# Patient Record
Sex: Female | Born: 1955 | Race: White | Hispanic: No | Marital: Married | Smoking: Current every day smoker
Health system: Southern US, Community
[De-identification: ages and names within clinical notes are randomized; demographics above are authoritative.]

## PROBLEM LIST (undated history)

## (undated) DIAGNOSIS — M1990 Unspecified osteoarthritis, unspecified site: Secondary | ICD-10-CM

## (undated) DIAGNOSIS — J449 Chronic obstructive pulmonary disease, unspecified: Secondary | ICD-10-CM

## (undated) DIAGNOSIS — R112 Nausea with vomiting, unspecified: Secondary | ICD-10-CM

## (undated) DIAGNOSIS — T7840XA Allergy, unspecified, initial encounter: Secondary | ICD-10-CM

## (undated) DIAGNOSIS — R569 Unspecified convulsions: Secondary | ICD-10-CM

## (undated) DIAGNOSIS — J939 Pneumothorax, unspecified: Secondary | ICD-10-CM

## (undated) DIAGNOSIS — Z5189 Encounter for other specified aftercare: Secondary | ICD-10-CM

## (undated) DIAGNOSIS — Z889 Allergy status to unspecified drugs, medicaments and biological substances status: Secondary | ICD-10-CM

## (undated) DIAGNOSIS — T8859XA Other complications of anesthesia, initial encounter: Secondary | ICD-10-CM

## (undated) DIAGNOSIS — Z9889 Other specified postprocedural states: Secondary | ICD-10-CM

## (undated) HISTORY — DX: Unspecified convulsions: R56.9

## (undated) HISTORY — DX: Encounter for other specified aftercare: Z51.89

## (undated) HISTORY — PX: COLONOSCOPY: SHX174

## (undated) HISTORY — DX: Pneumothorax, unspecified: J93.9

## (undated) HISTORY — DX: Allergy, unspecified, initial encounter: T78.40XA

## (undated) HISTORY — DX: Unspecified osteoarthritis, unspecified site: M19.90

## (undated) HISTORY — PX: POLYPECTOMY: SHX149

## (undated) HISTORY — DX: Chronic obstructive pulmonary disease, unspecified: J44.9

## (undated) HISTORY — PX: CARPAL TUNNEL RELEASE: SHX101

## (undated) HISTORY — PX: TUBAL LIGATION: SHX77

## (undated) HISTORY — DX: Allergy status to unspecified drugs, medicaments and biological substances: Z88.9

---

## 1981-02-15 HISTORY — PX: THORACOTOMY: SUR1349

## 1998-11-12 ENCOUNTER — Other Ambulatory Visit: Admission: RE | Admit: 1998-11-12 | Discharge: 1998-11-12 | Payer: Self-pay | Admitting: Gynecology

## 1999-09-03 ENCOUNTER — Encounter: Payer: Self-pay | Admitting: Gynecology

## 1999-09-03 ENCOUNTER — Encounter: Admission: RE | Admit: 1999-09-03 | Discharge: 1999-09-03 | Payer: Self-pay | Admitting: Gynecology

## 1999-09-08 ENCOUNTER — Encounter: Admission: RE | Admit: 1999-09-08 | Discharge: 1999-09-08 | Payer: Self-pay | Admitting: Gynecology

## 1999-09-08 ENCOUNTER — Encounter: Payer: Self-pay | Admitting: Gynecology

## 1999-09-09 ENCOUNTER — Encounter: Payer: Self-pay | Admitting: Gynecology

## 1999-10-21 ENCOUNTER — Other Ambulatory Visit: Admission: RE | Admit: 1999-10-21 | Discharge: 1999-10-21 | Payer: Self-pay | Admitting: Gynecology

## 2000-11-14 ENCOUNTER — Other Ambulatory Visit: Admission: RE | Admit: 2000-11-14 | Discharge: 2000-11-14 | Payer: Self-pay | Admitting: Gynecology

## 2000-11-16 ENCOUNTER — Encounter: Payer: Self-pay | Admitting: Gynecology

## 2000-11-16 ENCOUNTER — Encounter: Admission: RE | Admit: 2000-11-16 | Discharge: 2000-11-16 | Payer: Self-pay | Admitting: Gynecology

## 2002-04-04 ENCOUNTER — Other Ambulatory Visit: Admission: RE | Admit: 2002-04-04 | Discharge: 2002-04-04 | Payer: Self-pay | Admitting: Gynecology

## 2005-04-08 ENCOUNTER — Other Ambulatory Visit: Admission: RE | Admit: 2005-04-08 | Discharge: 2005-04-08 | Payer: Self-pay | Admitting: Gynecology

## 2005-04-28 ENCOUNTER — Encounter: Admission: RE | Admit: 2005-04-28 | Discharge: 2005-04-28 | Payer: Self-pay | Admitting: Gynecology

## 2006-05-02 ENCOUNTER — Encounter: Admission: RE | Admit: 2006-05-02 | Discharge: 2006-05-02 | Payer: Self-pay | Admitting: Gynecology

## 2007-05-31 ENCOUNTER — Encounter: Admission: RE | Admit: 2007-05-31 | Discharge: 2007-05-31 | Payer: Self-pay | Admitting: Gynecology

## 2008-06-04 ENCOUNTER — Encounter: Admission: RE | Admit: 2008-06-04 | Discharge: 2008-06-04 | Payer: Self-pay | Admitting: Gynecology

## 2009-06-05 ENCOUNTER — Encounter: Admission: RE | Admit: 2009-06-05 | Discharge: 2009-06-05 | Payer: Self-pay | Admitting: Gynecology

## 2009-06-12 ENCOUNTER — Encounter: Admission: RE | Admit: 2009-06-12 | Discharge: 2009-06-12 | Payer: Self-pay | Admitting: Gynecology

## 2010-03-08 ENCOUNTER — Encounter: Payer: Self-pay | Admitting: Gynecology

## 2010-05-11 ENCOUNTER — Encounter (INDEPENDENT_AMBULATORY_CARE_PROVIDER_SITE_OTHER): Payer: Self-pay | Admitting: *Deleted

## 2010-05-21 ENCOUNTER — Other Ambulatory Visit: Payer: Self-pay | Admitting: Gynecology

## 2010-05-21 DIAGNOSIS — Z1231 Encounter for screening mammogram for malignant neoplasm of breast: Secondary | ICD-10-CM

## 2010-06-08 ENCOUNTER — Ambulatory Visit
Admission: RE | Admit: 2010-06-08 | Discharge: 2010-06-08 | Disposition: A | Discharge status: Home / Self Care | Payer: BC Managed Care – PPO | Source: Ambulatory Visit | Attending: Gynecology | Admitting: Gynecology

## 2010-06-08 DIAGNOSIS — Z1231 Encounter for screening mammogram for malignant neoplasm of breast: Secondary | ICD-10-CM

## 2010-06-26 ENCOUNTER — Ambulatory Visit (AMBULATORY_SURGERY_CENTER): Payer: BC Managed Care – PPO | Admitting: *Deleted

## 2010-06-26 VITALS — Ht 65.0 in | Wt 128.0 lb

## 2010-06-26 DIAGNOSIS — Z1211 Encounter for screening for malignant neoplasm of colon: Secondary | ICD-10-CM

## 2010-07-09 ENCOUNTER — Telehealth: Payer: Self-pay | Admitting: *Deleted

## 2010-07-09 NOTE — Telephone Encounter (Signed)
Pt went to pick up prep for tomorrows procedure and it was not at the pharmacy.  She needs the prep sent to [facility] on [address] this morning.
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
## 2010-07-10 ENCOUNTER — Encounter: Payer: Self-pay | Admitting: Internal Medicine

## 2010-07-10 ENCOUNTER — Ambulatory Visit (AMBULATORY_SURGERY_CENTER): Payer: BC Managed Care – PPO | Admitting: Internal Medicine

## 2010-07-10 VITALS — BP 95/68 | HR 68 | Temp 98.1°F | Resp 18 | Ht 65.0 in | Wt 128.0 lb

## 2010-07-10 DIAGNOSIS — Z1211 Encounter for screening for malignant neoplasm of colon: Secondary | ICD-10-CM

## 2010-07-10 DIAGNOSIS — D126 Benign neoplasm of colon, unspecified: Secondary | ICD-10-CM

## 2010-07-10 MED ORDER — SODIUM CHLORIDE 0.9 % IV SOLN: 500.0000 mL | INTRAVENOUS | Status: AC

## 2010-07-10 NOTE — Patient Instructions (Signed)
PLEASE FOLLOW DISCHARGE INSTRUCTIONS GIVEN TODAY. RESUME CURRENT MEDICATIONS. COLON POLYPS X2 REMOVED, YOU WILL RECEIVE RESULT LETTER IN YOUR MAIL IN 1-2 WEEKS. TRY TO FOLLOW HIGH FIBER DIET, SEE HANDOUT GIVEN. CALL us WITH ANY QUESTIONS OR CONCERNS. WE WILL DO A FOLLOW UP CALL ON  Tuesday MORNING. REPEAT COLONOSCOPY IN 5 YEARS.

## 2010-07-14 ENCOUNTER — Telehealth: Payer: Self-pay | Admitting: *Deleted

## 2010-07-14 NOTE — Telephone Encounter (Signed)

## 2010-07-21 ENCOUNTER — Encounter: Payer: Self-pay | Admitting: Internal Medicine

## 2010-08-10 NOTE — Letter (Signed)
Summary: Pre Visit Letter Revised  [facility]  [address] [place], [place] [address]   Phone: [phone]  Fax: [phone]        05/11/2010 MRN: [id number] [facility] [address] [place], [place]  [address]  [place]             Procedure Date:  07-10-10           Direct Colon---Dr. Juanda Chance   Welcome to the Gastroenterology Division at [facility].    You are scheduled to see a nurse for your pre-procedure visit on 06-26-10 at 4:30p.m. on the 3rd floor at [facility], [address]  We ask that you try to arrive at our office 15 minutes prior to your appointment time to allow for check-in.  Please take a minute to review the attached form.  If you answer "Yes" to one or more of the questions on the first page, we ask that you call the person listed at your earliest opportunity.  If you answer "No" to all of the questions, please complete the rest of the form and bring it to your appointment.    Your nurse visit will consist of discussing your medical and surgical history, your immediate family medical history, and your medications.   If you are unable to list all of your medications on the form, please bring the medication bottles to your appointment and we will list them.  We will need to be aware of both prescribed and over the counter drugs.  We will need to know exact dosage information as well.    Please be prepared to read and sign documents such as consent forms, a financial agreement, and acknowledgement forms.  If necessary, and with your consent, a friend or relative is welcome to sit-in on the nurse visit with you.  Please bring your insurance card so that we may make a copy of it.  If your insurance requires a referral to see a specialist, please bring your referral form from your primary care physician.  No co-pay is required for this nurse visit.     If you cannot keep your appointment, please call [phone] to cancel or reschedule  prior to your appointment date.  This allows [place] the opportunity to schedule an appointment for another patient in need of care.    Thank you for choosing [facility] for your medical needs.  We appreciate the opportunity to care for you.  Please visit [place] at our website  to learn more about our practice.  Sincerely, The Gastroenterology Division

## 2010-11-19 NOTE — Progress Notes (Signed)
Addended by: Maple Hudson on: 11/19/2010 05:29 PM   Modules accepted: Level of Service

## 2011-05-07 ENCOUNTER — Other Ambulatory Visit: Payer: Self-pay | Admitting: Gynecology

## 2011-05-07 DIAGNOSIS — Z1231 Encounter for screening mammogram for malignant neoplasm of breast: Secondary | ICD-10-CM

## 2011-06-10 ENCOUNTER — Ambulatory Visit
Admission: RE | Admit: 2011-06-10 | Discharge: 2011-06-10 | Disposition: A | Discharge status: Home / Self Care | Payer: Private Health Insurance - Indemnity | Source: Ambulatory Visit | Attending: Gynecology | Admitting: Gynecology

## 2011-06-10 DIAGNOSIS — Z1231 Encounter for screening mammogram for malignant neoplasm of breast: Secondary | ICD-10-CM

## 2011-06-28 ENCOUNTER — Other Ambulatory Visit: Payer: Self-pay | Admitting: Gynecology

## 2012-05-24 ENCOUNTER — Other Ambulatory Visit: Payer: Self-pay

## 2012-05-24 DIAGNOSIS — Z1231 Encounter for screening mammogram for malignant neoplasm of breast: Secondary | ICD-10-CM

## 2012-06-12 ENCOUNTER — Ambulatory Visit: Payer: Private Health Insurance - Indemnity

## 2012-06-16 ENCOUNTER — Ambulatory Visit: Payer: Private Health Insurance - Indemnity

## 2013-09-01 ENCOUNTER — Emergency Department (HOSPITAL_COMMUNITY)
Admission: EM | Admit: 2013-09-01 | Discharge: 2013-09-01 | Disposition: A | Discharge status: Home / Self Care | Payer: No Typology Code available for payment source | Attending: Emergency Medicine | Admitting: Emergency Medicine

## 2013-09-01 ENCOUNTER — Emergency Department (HOSPITAL_COMMUNITY): Payer: No Typology Code available for payment source

## 2013-09-01 ENCOUNTER — Encounter (HOSPITAL_COMMUNITY): Payer: Self-pay | Admitting: Emergency Medicine

## 2013-09-01 DIAGNOSIS — S20219A Contusion of unspecified front wall of thorax, initial encounter: Secondary | ICD-10-CM

## 2013-09-01 DIAGNOSIS — IMO0002 Reserved for concepts with insufficient information to code with codable children: Secondary | ICD-10-CM | POA: Insufficient documentation

## 2013-09-01 DIAGNOSIS — F17200 Nicotine dependence, unspecified, uncomplicated: Secondary | ICD-10-CM | POA: Diagnosis not present

## 2013-09-01 DIAGNOSIS — Z23 Encounter for immunization: Secondary | ICD-10-CM | POA: Insufficient documentation

## 2013-09-01 DIAGNOSIS — Z79899 Other long term (current) drug therapy: Secondary | ICD-10-CM | POA: Diagnosis not present

## 2013-09-01 DIAGNOSIS — Y9389 Activity, other specified: Secondary | ICD-10-CM | POA: Insufficient documentation

## 2013-09-01 DIAGNOSIS — S61409A Unspecified open wound of unspecified hand, initial encounter: Secondary | ICD-10-CM | POA: Diagnosis not present

## 2013-09-01 DIAGNOSIS — R404 Transient alteration of awareness: Secondary | ICD-10-CM | POA: Insufficient documentation

## 2013-09-01 DIAGNOSIS — S298XXA Other specified injuries of thorax, initial encounter: Secondary | ICD-10-CM | POA: Diagnosis present

## 2013-09-01 DIAGNOSIS — Z1881 Retained glass fragments: Secondary | ICD-10-CM

## 2013-09-01 DIAGNOSIS — S01309A Unspecified open wound of unspecified ear, initial encounter: Secondary | ICD-10-CM | POA: Diagnosis not present

## 2013-09-01 DIAGNOSIS — Y92410 Unspecified street and highway as the place of occurrence of the external cause: Secondary | ICD-10-CM | POA: Insufficient documentation

## 2013-09-01 DIAGNOSIS — S61209A Unspecified open wound of unspecified finger without damage to nail, initial encounter: Secondary | ICD-10-CM | POA: Insufficient documentation

## 2013-09-01 DIAGNOSIS — S61412A Laceration without foreign body of left hand, initial encounter: Secondary | ICD-10-CM

## 2013-09-01 DIAGNOSIS — S01311A Laceration without foreign body of right ear, initial encounter: Secondary | ICD-10-CM

## 2013-09-01 DIAGNOSIS — R911 Solitary pulmonary nodule: Secondary | ICD-10-CM

## 2013-09-01 MED ORDER — HYDROMORPHONE HCL PF 1 MG/ML IJ SOLN
1.0000 mg | Freq: Once | INTRAMUSCULAR | Status: AC
  Administered 2013-09-01: 1 mg via INTRAVENOUS
  Filled 2013-09-01: qty 1

## 2013-09-01 MED ORDER — OXYCODONE-ACETAMINOPHEN 5-325 MG PO TABS: 2.0000 | ORAL_TABLET | Freq: Four times a day (QID) | ORAL | Status: DC | PRN

## 2013-09-01 MED ORDER — TETANUS-DIPHTH-ACELL PERTUSSIS 5-2.5-18.5 LF-MCG/0.5 IM SUSP
0.5000 mL | Freq: Once | INTRAMUSCULAR | Status: AC
  Administered 2013-09-01: 0.5 mL via INTRAMUSCULAR
  Filled 2013-09-01: qty 0.5

## 2013-09-01 MED ORDER — IOHEXOL 300 MG/ML  SOLN
75.0000 mL | Freq: Once | INTRAMUSCULAR | Status: AC | PRN
  Administered 2013-09-01: 75 mL via INTRAVENOUS

## 2013-09-01 MED ORDER — ONDANSETRON HCL 4 MG/2ML IJ SOLN
4.0000 mg | Freq: Once | INTRAMUSCULAR | Status: AC
  Administered 2013-09-01: 4 mg via INTRAVENOUS
  Filled 2013-09-01: qty 2

## 2013-09-01 MED ORDER — SODIUM CHLORIDE 0.9 % IV BOLUS (SEPSIS)
1000.0000 mL | Freq: Once | INTRAVENOUS | Status: AC
  Administered 2013-09-01: 1000 mL via INTRAVENOUS
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
## 2013-09-01 NOTE — ED Provider Notes (Signed)
LACERATION REPAIR Performed by: Doreene Burke PA-S2 under my supervision Authorized by: Faustino Congress Consent: Verbal consent obtained. Risks and benefits: risks, benefits and alternatives were discussed Consent given by: patient Patient identity confirmed: provided demographic data Prepped and Draped in normal sterile fashion Wound explored  Laceration Location: left hand, base of 2nd digit  Laceration Length: 1.5 cm  No Foreign Bodies seen or palpated  Anesthesia: local infiltration  Local anesthetic: lidocaine 1%  Anesthetic total: 3 ml  Irrigation method: bulk flow with 1000cc NS, skin scrub Amount of cleaning: standard  Skin closure: 5-0 Ethilon  Number of sutures: 2  Technique: simple interrupted  Patient tolerance: Patient tolerated the procedure well with no immediate complications.    LACERATION REPAIR Performed by: Doreene Burke PA-S2 under my supervision Authorized by: Faustino Congress Consent: Verbal consent obtained. Risks and benefits: risks, benefits and alternatives were discussed Consent given by: patient Patient identity confirmed: provided demographic data Prepped and Draped in normal sterile fashion Wound explored  Laceration Location: 3rd digit, volar near crease of PIP  Laceration Length: 1 cm  No Foreign Bodies seen or palpated  Anesthesia: local infiltration  Local anesthetic: lidocaine 1%  Anesthetic total: 1 ml  Irrigation method: syringe with saline Amount of cleaning: standard  Skin closure: 5-0 Ethilon  Number of sutures: 1  Technique: simple interrupted  Patient tolerance: Patient tolerated the procedure well with no immediate complications.     LACERATION REPAIR Performed by: Doreene Burke PA-S2 under my supervision Authorized by: Faustino Congress Consent: Verbal consent obtained. Risks and benefits: risks, benefits and alternatives were discussed Consent given by:  patient Patient identity confirmed: provided demographic data Prepped and Draped in normal sterile fashion Wound explored  Laceration Location: inferior R ear onto face  Laceration Length: 2.5cm  No Foreign Bodies seen or palpated  Anesthesia: local infiltration  Local anesthetic: lidocaine 1%  Anesthetic total: 3 ml  Irrigation method: syringe Amount of cleaning: standard  Skin closure: 6-0 Ethilon  Number of sutures: 6  Technique: simple interrupted  Patient tolerance: Patient tolerated the procedure well with no immediate complications.   Carlisle Cater, PA-C 09/01/13 1718

## 2013-09-01 NOTE — ED Provider Notes (Signed)
Need for f/u for CT possible nodule d/w Pt/family.  Marisa Relic, MD 09/01/13 [phone]
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
## 2013-09-01 NOTE — ED Notes (Signed)
Patient to CT by stretcher/spine precautions maintained

## 2013-11-29 ENCOUNTER — Other Ambulatory Visit: Payer: Self-pay | Admitting: Physician Assistant

## 2013-11-29 DIAGNOSIS — R9389 Abnormal findings on diagnostic imaging of other specified body structures: Secondary | ICD-10-CM

## 2013-12-06 ENCOUNTER — Ambulatory Visit
Admission: RE | Admit: 2013-12-06 | Discharge: 2013-12-06 | Disposition: A | Discharge status: Home / Self Care | Payer: 59 | Source: Ambulatory Visit | Attending: Physician Assistant | Admitting: Physician Assistant

## 2013-12-06 DIAGNOSIS — R9389 Abnormal findings on diagnostic imaging of other specified body structures: Secondary | ICD-10-CM

## 2013-12-13 ENCOUNTER — Encounter: Payer: Self-pay | Admitting: Internal Medicine

## 2013-12-13 ENCOUNTER — Other Ambulatory Visit (INDEPENDENT_AMBULATORY_CARE_PROVIDER_SITE_OTHER): Payer: 59

## 2013-12-13 ENCOUNTER — Encounter (INDEPENDENT_AMBULATORY_CARE_PROVIDER_SITE_OTHER): Payer: Self-pay

## 2013-12-13 ENCOUNTER — Ambulatory Visit (INDEPENDENT_AMBULATORY_CARE_PROVIDER_SITE_OTHER): Payer: 59 | Admitting: Internal Medicine

## 2013-12-13 VITALS — BP 120/70 | HR 69 | Ht 65.0 in | Wt 130.0 lb

## 2013-12-13 DIAGNOSIS — R918 Other nonspecific abnormal finding of lung field: Secondary | ICD-10-CM

## 2013-12-13 DIAGNOSIS — J449 Chronic obstructive pulmonary disease, unspecified: Secondary | ICD-10-CM

## 2013-12-13 LAB — CBC WITH DIFFERENTIAL/PLATELET
Basophils Absolute: 0 10*3/uL (ref 0.0–0.1)
Basophils Relative: 0.6 % (ref 0.0–3.0)
EOS PCT: 3 % (ref 0.0–5.0)
Eosinophils Absolute: 0.2 10*3/uL (ref 0.0–0.7)
HCT: 45.8 % (ref 36.0–46.0)
Hemoglobin: 15.3 g/dL — ABNORMAL HIGH (ref 12.0–15.0)
LYMPHS PCT: 32.2 % (ref 12.0–46.0)
Lymphs Abs: 2.2 10*3/uL (ref 0.7–4.0)
MCHC: 33.3 g/dL (ref 30.0–36.0)
MCV: 86.3 fl (ref 78.0–100.0)
Monocytes Absolute: 0.5 10*3/uL (ref 0.1–1.0)
Monocytes Relative: 8 % (ref 3.0–12.0)
NEUTROS PCT: 56.2 % (ref 43.0–77.0)
Neutro Abs: 3.9 10*3/uL (ref 1.4–7.7)
PLATELETS: 198 10*3/uL (ref 150.0–400.0)
RBC: 5.32 Mil/uL — ABNORMAL HIGH (ref 3.87–5.11)
RDW: 14.3 % (ref 11.5–15.5)
WBC: 6.9 10*3/uL (ref 4.0–10.5)

## 2013-12-13 LAB — SEDIMENTATION RATE: Sed Rate: 20 mm/hr (ref 0–22)

## 2013-12-13 MED ORDER — LEVOFLOXACIN 500 MG PO TABS: 500.0000 mg | ORAL_TABLET | Freq: Every day | ORAL | Status: DC

## 2013-12-13 NOTE — Patient Instructions (Addendum)
Please remember to go to the lab   department downstairs for your tests - we will call you with the results when they are available.  Levaquin 500 mg daily x 10 days then high resolution CT chest in 2 weeks - see coordinator to schedule  We need to see you same day as CT
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
## 2013-12-15 DIAGNOSIS — J449 Chronic obstructive pulmonary disease, unspecified: Secondary | ICD-10-CM | POA: Insufficient documentation

## 2013-12-15 NOTE — Assessment & Plan Note (Signed)
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

## 2013-12-15 NOTE — Assessment & Plan Note (Signed)
Needs pft and quit smoking, discussed  Also sent for alpha one Testing to be complete

## 2013-12-16 LAB — QUANTIFERON TB GOLD ASSAY (BLOOD)
INTERFERON GAMMA RELEASE ASSAY: NEGATIVE
Mitogen value: 3.41 IU/mL
Quantiferon Nil Value: 0.02 IU/mL
Quantiferon Tb Ag Minus Nil Value: 0 IU/mL
TB Ag value: 0.02 IU/mL

## 2013-12-17 LAB — ALPHA-1-ANTITRYPSIN: A1 ANTITRYPSIN SER: 190 mg/dL (ref 83–199)

## 2013-12-20 LAB — ALPHA-1 ANTITRYPSIN PHENOTYPE: A1 ANTITRYPSIN: 179 mg/dL (ref 83–199)

## 2013-12-20 NOTE — Progress Notes (Signed)
Quick Note:  Spoke with pt and notified of results per Dr. Wert. Pt verbalized understanding and denied any questions.  ______ 

## 2013-12-27 ENCOUNTER — Inpatient Hospital Stay: Admission: RE | Admit: 2013-12-27 | Payer: 59 | Source: Ambulatory Visit

## 2013-12-28 ENCOUNTER — Ambulatory Visit (INDEPENDENT_AMBULATORY_CARE_PROVIDER_SITE_OTHER)
Admission: RE | Admit: 2013-12-28 | Discharge: 2013-12-28 | Disposition: A | Discharge status: Home / Self Care | Payer: 59 | Source: Ambulatory Visit | Attending: Internal Medicine | Admitting: Internal Medicine

## 2013-12-28 ENCOUNTER — Ambulatory Visit: Payer: 59 | Admitting: Internal Medicine

## 2013-12-28 DIAGNOSIS — R918 Other nonspecific abnormal finding of lung field: Secondary | ICD-10-CM

## 2013-12-31 ENCOUNTER — Encounter: Payer: Self-pay | Admitting: Internal Medicine

## 2013-12-31 ENCOUNTER — Ambulatory Visit (INDEPENDENT_AMBULATORY_CARE_PROVIDER_SITE_OTHER): Payer: 59 | Admitting: Internal Medicine

## 2013-12-31 ENCOUNTER — Other Ambulatory Visit (INDEPENDENT_AMBULATORY_CARE_PROVIDER_SITE_OTHER): Payer: 59

## 2013-12-31 VITALS — BP 106/60 | HR 80 | Ht 65.0 in | Wt 132.0 lb

## 2013-12-31 DIAGNOSIS — J479 Bronchiectasis, uncomplicated: Secondary | ICD-10-CM | POA: Insufficient documentation

## 2013-12-31 DIAGNOSIS — R918 Other nonspecific abnormal finding of lung field: Secondary | ICD-10-CM

## 2013-12-31 DIAGNOSIS — J449 Chronic obstructive pulmonary disease, unspecified: Secondary | ICD-10-CM

## 2013-12-31 DIAGNOSIS — F17210 Nicotine dependence, cigarettes, uncomplicated: Secondary | ICD-10-CM

## 2013-12-31 DIAGNOSIS — Z720 Tobacco use: Secondary | ICD-10-CM

## 2013-12-31 LAB — CBC WITH DIFFERENTIAL/PLATELET
BASOS ABS: 0.1 10*3/uL (ref 0.0–0.1)
Basophils Relative: 0.7 % (ref 0.0–3.0)
Eosinophils Absolute: 0.4 10*3/uL (ref 0.0–0.7)
Eosinophils Relative: 5.3 % — ABNORMAL HIGH (ref 0.0–5.0)
HCT: 44.2 % (ref 36.0–46.0)
HEMOGLOBIN: 14.3 g/dL (ref 12.0–15.0)
LYMPHS PCT: 29.5 % (ref 12.0–46.0)
Lymphs Abs: 2.5 10*3/uL (ref 0.7–4.0)
MCHC: 32.3 g/dL (ref 30.0–36.0)
MCV: 87.5 fl (ref 78.0–100.0)
MONOS PCT: 7.3 % (ref 3.0–12.0)
Monocytes Absolute: 0.6 10*3/uL (ref 0.1–1.0)
Neutro Abs: 4.8 10*3/uL (ref 1.4–7.7)
Neutrophils Relative %: 57.2 % (ref 43.0–77.0)
Platelets: 194 10*3/uL (ref 150.0–400.0)
RBC: 5.06 Mil/uL (ref 3.87–5.11)
RDW: 14.3 % (ref 11.5–15.5)
WBC: 8.3 10*3/uL (ref 4.0–10.5)

## 2013-12-31 MED ORDER — AMOXICILLIN-POT CLAVULANATE 875-125 MG PO TABS: 1.0000 | ORAL_TABLET | Freq: Two times a day (BID) | ORAL | Status: DC
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
## 2013-12-31 NOTE — Patient Instructions (Addendum)
Bronchiectasis =   you have scarring of your bronchial tubes which means that they don't function perfectly normally and mucus tends to pool in certain areas of your lung which can cause pneumonia and further scarring of your lung and bronchial tubes  Whenever you develop cough congestion take mucinex or mucinex dm > these will help keep the mucus loose and flowing but if your condition worsens you need to seek help immediately preferably here or somewhere inside the [facility] system to compare xrays ( worse = darker or bloody mucus or pain on breathing in)     Please see patient coordinator before you leave today  to schedule sinus CT at your convenience   Please remember to go to the lab  department downstairs for your tests - we will call you with the results when they are available.  If get worse cough or sinus symtpoms take Augmentin 875 mg take one pill twice daily  X 10 days - take at breakfast and supper with large glass of water.  It would help reduce the usual side effects (diarrhea and yeast infections) if you ate cultured yogurt at lunch.   The key is to stop smoking completely before smoking completely stops you!     Late add :  F/u one m with pfts

## 2014-01-01 ENCOUNTER — Encounter: Payer: Self-pay | Admitting: Internal Medicine

## 2014-01-01 ENCOUNTER — Other Ambulatory Visit: Payer: Self-pay | Admitting: Internal Medicine

## 2014-01-01 DIAGNOSIS — F17210 Nicotine dependence, cigarettes, uncomplicated: Secondary | ICD-10-CM | POA: Insufficient documentation

## 2014-01-01 DIAGNOSIS — J449 Chronic obstructive pulmonary disease, unspecified: Secondary | ICD-10-CM

## 2014-01-01 LAB — IMMUNOGLOBULINS A/E/G/M, SERUM
IGE (IMMUNOGLOBULIN E), SERUM: 10 [IU]/mL (ref 0–100)
IGM (IMMUNOGLOBULIN M), SRM: 209 mg/dL (ref 40–230)
IgA/Immunoglobulin A, Serum: 196 mg/dL (ref 91–414)
IgG (Immunoglobin G), Serum: 986 mg/dL (ref 700–1600)

## 2014-01-01 LAB — ANCA SCREEN W REFLEX TITER
ATYPICAL P-ANCA SCREEN: NEGATIVE
C-ANCA SCREEN: NEGATIVE
p-ANCA Screen: NEGATIVE

## 2014-01-01 NOTE — Progress Notes (Signed)
Quick Note:  LMTCB ______ 

## 2014-01-01 NOTE — Assessment & Plan Note (Signed)
-   Complicated by L PTX in the 1980s requiring surgery  - alpha one testing 10/291/5 > MM  No need for meds at this point, see smoking a/p Needs baseline pfts

## 2014-01-01 NOTE — Assessment & Plan Note (Signed)
I took an extended  opportunity with this patient to outline the consequences of continued cigarette use  in airway disorders based on all the data we have from the multiple national lung health studies (perfomed over decades at millions of dollars in cost)  indicating that smoking cessation, not choice of inhalers or physicians, is the most important aspect of care.   

## 2014-01-01 NOTE — Assessment & Plan Note (Signed)
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

## 2014-01-01 NOTE — Progress Notes (Signed)
Quick Note:  Spoke with pt and notified of results per Dr. Wert. Pt verbalized understanding and denied any questions.  ______ 

## 2014-01-01 NOTE — Assessment & Plan Note (Addendum)
Alpha one screening 12/13/13  MM See HRCT  Chest 12/31/2013  - Quant Ig s 12/31/13 wnl  Also needs sinus ct to complete the w/u

## 2014-01-02 NOTE — Progress Notes (Signed)
Quick Note:  Pt aware ______ 

## 2014-01-04 ENCOUNTER — Ambulatory Visit (INDEPENDENT_AMBULATORY_CARE_PROVIDER_SITE_OTHER)
Admission: RE | Admit: 2014-01-04 | Discharge: 2014-01-04 | Disposition: A | Discharge status: Home / Self Care | Payer: 59 | Source: Ambulatory Visit | Attending: Internal Medicine | Admitting: Internal Medicine

## 2014-01-04 DIAGNOSIS — J479 Bronchiectasis, uncomplicated: Secondary | ICD-10-CM

## 2014-01-07 NOTE — Progress Notes (Signed)
Quick Note:  LMTCB ______ 

## 2014-01-08 NOTE — Progress Notes (Signed)
Quick Note:  Spoke with pt and notified of results per Dr. Wert. Pt verbalized understanding and denied any questions.  ______ 

## 2014-01-31 ENCOUNTER — Ambulatory Visit: Payer: 59 | Admitting: Internal Medicine

## 2015-05-07 ENCOUNTER — Ambulatory Visit (INDEPENDENT_AMBULATORY_CARE_PROVIDER_SITE_OTHER): Payer: Commercial Managed Care - HMO | Admitting: Physician Assistant

## 2015-05-07 VITALS — BP 114/82 | HR 82 | Temp 98.9°F | Resp 16 | Ht 66.0 in | Wt 124.2 lb

## 2015-05-07 DIAGNOSIS — F17210 Nicotine dependence, cigarettes, uncomplicated: Secondary | ICD-10-CM | POA: Diagnosis not present

## 2015-05-07 DIAGNOSIS — J069 Acute upper respiratory infection, unspecified: Secondary | ICD-10-CM | POA: Diagnosis not present

## 2015-05-07 DIAGNOSIS — B9789 Other viral agents as the cause of diseases classified elsewhere: Secondary | ICD-10-CM

## 2015-05-07 MED ORDER — AZITHROMYCIN 250 MG PO TABS: ORAL_TABLET | ORAL | Status: DC

## 2015-05-07 MED ORDER — PREDNISONE 20 MG PO TABS: ORAL_TABLET | ORAL | Status: DC

## 2015-05-07 NOTE — Patient Instructions (Signed)
     IF you received an x-ray today, you will receive an invoice from [facility]. Please contact [facility] at [phone] with questions or concerns regarding your invoice.   IF you received labwork today, you will receive an invoice from [facility]. Please contact [facility] at [phone] with questions or concerns regarding your invoice.   Our billing staff will not be able to assist you with questions regarding bills from these companies.  You will be contacted with the lab results as soon as they are available. The fastest way to get your results is to activate your My Chart account. Instructions are located on the last page of this paperwork. If you have not heard from us regarding the results in 2 weeks, please contact this office.      

## 2015-05-07 NOTE — Progress Notes (Signed)
05/07/2015 3:55 PM   DOB: 04/17/1955 / MRN: [id number]  SUBJECTIVE:  Marisa Golden is a 60 y.o. female presenting for cough that has been present for 12 days total. She also complains of nasal congestion. She has tried Mucinex without relief.  She feels that she is getting worse.  She has a 40 pack year history of smoking and is not interested in quitting today.  She denies fever, chills, nausea, and diaphoresis.  She denies SOB, DOE and chest pain.    She is allergic to morphine and related.   She  has a past medical history of Pneumothorax and Multiple allergies.    She  reports that she has been smoking Cigarettes.  She has a 40 pack-year smoking history. She has never used smokeless tobacco. She reports that she does not drink alcohol or use illicit drugs. She  has no sexual activity history on file. The patient  has past surgical history that includes Thoracotomy (1983); Tubal ligation; and Carpal tunnel release.  Her family history includes Breast cancer in her maternal aunt.  Review of Systems  Constitutional: Positive for malaise/fatigue. Negative for fever, chills and diaphoresis.  HENT: Positive for congestion and sore throat.   Respiratory: Positive for cough. Negative for hemoptysis, shortness of breath and wheezing.   Cardiovascular: Negative for chest pain.  Gastrointestinal: Negative for nausea.  Skin: Negative for rash.  Neurological: Negative for dizziness and weakness.  Endo/Heme/Allergies: Negative for polydipsia.    Problem list and medications reviewed and updated by myself where necessary, and exist elsewhere in the encounter.   OBJECTIVE:  BP 114/82 mmHg  Pulse 82  Temp(Src) 98.9 F (37.2 C) (Oral)  Resp 16  Ht 5\' 6"  (1.676 m)  Wt 124 lb 3.2 oz (56.337 kg)  BMI 20.06 kg/m2  SpO2 95%  Physical Exam  Constitutional: She is oriented to person, place, and time.  HENT:  Right Ear: External ear normal.  Left Ear: External ear normal.  Nose: Mucosal edema  present. Right sinus exhibits no maxillary sinus tenderness and no frontal sinus tenderness. Left sinus exhibits no maxillary sinus tenderness and no frontal sinus tenderness.  Mouth/Throat: Oropharynx is clear and moist. No oropharyngeal exudate.  Eyes: Conjunctivae are normal. Pupils are equal, round, and reactive to light.  Cardiovascular: Regular rhythm and normal heart sounds.   Pulmonary/Chest: Effort normal and breath sounds normal. No respiratory distress. She has no wheezes. She has no rales. She exhibits no tenderness.  Neurological: She is alert and oriented to person, place, and time.  Skin: Skin is warm and dry. No rash noted. She is not diaphoretic. No erythema.  Psychiatric: Her behavior is normal.    No results found for this or any previous visit (from the past 72 hour(s)).  No results found.  ASSESSMENT AND PLAN  Lorie was seen today for sinus problem and chest congestion.  Diagnoses and all orders for this visit:  Smoking greater than 40 pack years -     azithromycin (ZITHROMAX) 250 MG tablet; Do not fill until 05/12/15.  Viral upper respiratory tract infection with cough: Given problem one and the duration of her symtpoms I am worried she may be having a COPD flare. I doubt infection, however advised that is the prednisone does not make her better then she may fill the abx as written.  RTC if worse or if not improved with the plan.  -     predniSONE (DELTASONE) 20 MG tablet; Take two in the morning  with food.   The patient was advised to call or return to clinic if she does not see an improvement in symptoms or to seek the care of the closest emergency department if she worsens with the above plan.   Philis Fendt, MHS, PA-C [facility] 05/07/2015 3:55 PM

## 2015-06-04 ENCOUNTER — Encounter: Payer: Self-pay | Admitting: Gastroenterology

## 2015-06-27 ENCOUNTER — Ambulatory Visit
Admission: RE | Admit: 2015-06-27 | Discharge: 2015-06-27 | Disposition: A | Discharge status: Home / Self Care | Payer: Commercial Managed Care - HMO | Source: Ambulatory Visit | Attending: Physician Assistant | Admitting: Physician Assistant

## 2015-06-27 ENCOUNTER — Other Ambulatory Visit: Payer: Self-pay | Admitting: Physician Assistant

## 2015-06-27 DIAGNOSIS — R059 Cough, unspecified: Secondary | ICD-10-CM

## 2015-06-27 DIAGNOSIS — R05 Cough: Secondary | ICD-10-CM

## 2015-07-10 ENCOUNTER — Ambulatory Visit (AMBULATORY_SURGERY_CENTER): Payer: Self-pay | Admitting: *Deleted

## 2015-07-10 VITALS — Ht 65.0 in | Wt 121.6 lb

## 2015-07-10 DIAGNOSIS — Z86010 Personal history of colonic polyps: Secondary | ICD-10-CM

## 2015-07-10 NOTE — Progress Notes (Signed)
No egg or soy allergy known to patient   issues with past sedation with any surgeries  or procedures of post op n/V  no intubation problems  No diet pills per patient No home 02 use per patient  No blood thinners per patient  Pt denies issues with constipation

## 2015-07-18 ENCOUNTER — Encounter: Payer: Self-pay | Admitting: Gastroenterology

## 2015-07-25 ENCOUNTER — Encounter: Payer: Self-pay | Admitting: Gastroenterology

## 2015-07-25 ENCOUNTER — Ambulatory Visit (AMBULATORY_SURGERY_CENTER): Payer: Commercial Managed Care - HMO | Admitting: Gastroenterology

## 2015-07-25 VITALS — BP 122/81 | HR 69 | Temp 97.8°F | Resp 14 | Ht 65.0 in | Wt 121.0 lb

## 2015-07-25 DIAGNOSIS — D125 Benign neoplasm of sigmoid colon: Secondary | ICD-10-CM | POA: Diagnosis not present

## 2015-07-25 DIAGNOSIS — Z86010 Personal history of colonic polyps: Secondary | ICD-10-CM | POA: Diagnosis not present

## 2015-07-25 DIAGNOSIS — D128 Benign neoplasm of rectum: Secondary | ICD-10-CM

## 2015-07-25 MED ORDER — SODIUM CHLORIDE 0.9 % IV SOLN: 500.0000 mL | INTRAVENOUS | Status: DC

## 2015-07-25 NOTE — Progress Notes (Signed)
Called to room to assist during endoscopic procedure.  Patient ID and intended procedure confirmed with present staff. Received instructions for my participation in the procedure from the performing physician.  

## 2015-07-25 NOTE — Patient Instructions (Signed)
FOLLOW DISCHARGE INSTRUCTIONS (BLUE AND GREEN SHEETS).YOU HAD AN ENDOSCOPIC PROCEDURE TODAY AT [facility]:   Refer to the procedure report that was given to you for any specific questions about what was found during the examination.  If the procedure report does not answer your questions, please call your gastroenterologist to clarify.  If you requested that your care partner not be given the details of your procedure findings, then the procedure report has been included in a sealed envelope for you to review at your convenience later.  YOU SHOULD EXPECT: Some feelings of bloating in the abdomen. Passage of more gas than usual.  Walking can help get rid of the air that was put into your GI tract during the procedure and reduce the bloating. If you had a lower endoscopy (such as a colonoscopy or flexible sigmoidoscopy) you may notice spotting of blood in your stool or on the toilet paper. If you underwent a bowel prep for your procedure, you may not have a normal bowel movement for a few days.  Please Note:  You might notice some irritation and congestion in your nose or some drainage.  This is from the oxygen used during your procedure.  There is no need for concern and it should clear up in a day or so.  SYMPTOMS TO REPORT IMMEDIATELY:   Following lower endoscopy (colonoscopy or flexible sigmoidoscopy):  Excessive amounts of blood in the stool  Significant tenderness or worsening of abdominal pains  Swelling of the abdomen that is new, acute  Fever of 100F or higher   For urgent or emergent issues, a gastroenterologist can be reached at any hour by calling [phone].   DIET: Your first meal following the procedure should be a small meal and then it is ok to progress to your normal diet. Heavy or fried foods are harder to digest and may make you feel nauseous or bloated.  Likewise, meals heavy in dairy and vegetables can increase bloating.  Drink plenty of fluids but you  should avoid alcoholic beverages for 24 hours.  ACTIVITY:  You should plan to take it easy for the rest of today and you should NOT DRIVE or use heavy machinery until tomorrow (because of the sedation medicines used during the test).    FOLLOW UP: Our staff will call the number listed on your records the next business day following your procedure to check on you and address any questions or concerns that you may have regarding the information given to you following your procedure. If we do not reach you, we will leave a message.  However, if you are feeling well and you are not experiencing any problems, there is no need to return our call.  We will assume that you have returned to your regular daily activities without incident.  If any biopsies were taken you will be contacted by phone or by letter within the next 1-3 weeks.  Please call us at [phone] if you have not heard about the biopsies in 3 weeks.    SIGNATURES/CONFIDENTIALITY: You and/or your care partner have signed paperwork which will be entered into your electronic medical record.  These signatures attest to the fact that that the information above on your After Visit Summary has been reviewed and is understood.  Full responsibility of the confidentiality of this discharge information lies with you and/or your care-partner.  Polyps, diverticulosis, high fiber diet information.

## 2015-07-25 NOTE — Progress Notes (Signed)
Patient awakening,vss,report to rn 

## 2015-07-25 NOTE — Op Note (Signed)
[facility] Patient Name: Marisa Golden Procedure Date: 07/25/2015 9:10 AM MRN: [id number] Endoscopist: Mallie Mussel L. Loletha Carrow , MD Age: 60 Referring MD:  Date of Birth: 1955-10-04 Gender: Female Procedure:                Colonoscopy Indications:              Surveillance: Personal history of adenomatous                            polyps on last colonoscopy > 5 years ago (36mm TA in                            06/2010) Medicines:                Monitored Anesthesia Care Procedure:                Pre-Anesthesia Assessment:                           - Prior to the procedure, a History and Physical                            was performed, and patient medications and                            allergies were reviewed. The patient's tolerance of                            previous anesthesia was also reviewed. The risks                            and benefits of the procedure and the sedation                            options and risks were discussed with the patient.                            All questions were answered, and informed consent                            was obtained. Prior Anticoagulants: The patient has                            taken no previous anticoagulant or antiplatelet                            agents. ASA Grade Assessment: II - A patient with                            mild systemic disease. After reviewing the risks                            and benefits, the patient was deemed in  satisfactory condition to undergo the procedure.                           After obtaining informed consent, the colonoscope                            was passed under direct vision. Throughout the                            procedure, the patient's blood pressure, pulse, and                            oxygen saturations were monitored continuously. The                            Model CF-HQ190L [id number]) scope was introduced   through the anus and advanced to the the cecum,                            identified by appendiceal orifice and ileocecal                            valve. The colonoscopy was performed without                            difficulty. The patient tolerated the procedure                            well. The quality of the bowel preparation was                            excellent. The ileocecal valve, appendiceal                            orifice, and rectum were photographed. The bowel                            preparation used was Miralax. Scope In: 9:17:13 AM Scope Out: 9:37:30 AM Scope Withdrawal Time: 0 hours 13 minutes 28 seconds  Total Procedure Duration: 0 hours 20 minutes 17 seconds  Findings:                 The perianal and digital rectal examinations were                            normal.                           A 2 mm polyp was found in the proximal sigmoid                            colon. The polyp was sessile. The polyp was removed                            with a cold biopsy forceps. Resection and  retrieval                            were complete.                           A 6 mm polyp was found in the rectum. The polyp was                            sessile. The polyp was removed with a hot snare.                            Resection and retrieval were complete.                           Multiple small-mouthed diverticula were found in                            the sigmoid colon.                           Retroflexion in the rectum was not performed due to                            anatomy.                           The exam was otherwise without abnormality. Complications:            No immediate complications. Estimated Blood Loss:     Estimated blood loss: none. Impression:               - One 2 mm polyp in the proximal sigmoid colon,                            removed with a cold biopsy forceps. Resected and                            retrieved.                            - One 6 mm polyp in the rectum, removed with a hot                            snare. Resected and retrieved.                           - Diverticulosis in the sigmoid colon.                           - The examination was otherwise normal. Recommendation:           - Patient has a contact number available for                            emergencies. The signs and symptoms of potential  delayed complications were discussed with the                            patient. Return to normal activities tomorrow.                            Written discharge instructions were provided to the                            patient.                           - Resume previous diet.                           - No aspirin, ibuprofen, naproxen, or other                            non-steroidal anti-inflammatory drugs for 5 days                            after polyp removal.                           - Await pathology results.                           - Repeat colonoscopy is recommended for                            surveillance. The colonoscopy date will be                            determined after pathology results from today's                            exam become available for review. Henry L. Loletha Carrow, MD 07/25/2015 9:42:39 AM This report has been signed electronically.

## 2015-07-28 ENCOUNTER — Telehealth: Payer: Self-pay

## 2015-07-28 NOTE — Telephone Encounter (Signed)
  Follow up Call-  Call back number 07/25/2015  Post procedure Call Back phone  # [phone]  Permission to leave phone message Yes     Patient questions:  Do you have a fever, pain , or abdominal swelling? No. Pain Score  0 *  Have you tolerated food without any problems? Yes.    Have you been able to return to your normal activities? Yes.    Do you have any questions about your discharge instructions: Diet   No. Medications  No. Follow up visit  No.  Do you have questions or concerns about your Care? No.  Actions: * If pain score is 4 or above: No action needed, pain <4.

## 2015-07-29 ENCOUNTER — Encounter: Payer: Self-pay | Admitting: Gastroenterology

## 2015-10-13 ENCOUNTER — Other Ambulatory Visit: Payer: Self-pay | Admitting: Obstetrics & Gynecology

## 2015-10-13 DIAGNOSIS — R928 Other abnormal and inconclusive findings on diagnostic imaging of breast: Secondary | ICD-10-CM

## 2015-10-15 ENCOUNTER — Ambulatory Visit
Admission: RE | Admit: 2015-10-15 | Discharge: 2015-10-15 | Disposition: A | Discharge status: Home / Self Care | Payer: Commercial Managed Care - HMO | Source: Ambulatory Visit | Attending: Obstetrics & Gynecology | Admitting: Obstetrics & Gynecology

## 2015-10-15 DIAGNOSIS — R928 Other abnormal and inconclusive findings on diagnostic imaging of breast: Secondary | ICD-10-CM

## 2016-03-19 ENCOUNTER — Other Ambulatory Visit: Payer: Self-pay | Admitting: Obstetrics & Gynecology

## 2016-03-19 DIAGNOSIS — N6310 Unspecified lump in the right breast, unspecified quadrant: Secondary | ICD-10-CM

## 2016-04-15 ENCOUNTER — Ambulatory Visit
Admission: RE | Admit: 2016-04-15 | Discharge: 2016-04-15 | Disposition: A | Discharge status: Home / Self Care | Payer: Commercial Managed Care - HMO | Source: Ambulatory Visit | Attending: Obstetrics & Gynecology | Admitting: Obstetrics & Gynecology

## 2016-04-15 ENCOUNTER — Other Ambulatory Visit: Payer: Self-pay | Admitting: Obstetrics & Gynecology

## 2016-04-15 DIAGNOSIS — N6310 Unspecified lump in the right breast, unspecified quadrant: Secondary | ICD-10-CM

## 2018-01-18 ENCOUNTER — Other Ambulatory Visit: Payer: Self-pay | Admitting: Obstetrics & Gynecology

## 2018-01-18 DIAGNOSIS — R928 Other abnormal and inconclusive findings on diagnostic imaging of breast: Secondary | ICD-10-CM

## 2018-01-25 ENCOUNTER — Ambulatory Visit: Payer: Commercial Managed Care - HMO

## 2018-01-25 ENCOUNTER — Ambulatory Visit
Admission: RE | Admit: 2018-01-25 | Discharge: 2018-01-25 | Disposition: A | Discharge status: Home / Self Care | Payer: Managed Care, Other (non HMO) | Source: Ambulatory Visit | Attending: Obstetrics & Gynecology | Admitting: Obstetrics & Gynecology

## 2018-01-25 DIAGNOSIS — R928 Other abnormal and inconclusive findings on diagnostic imaging of breast: Secondary | ICD-10-CM

## 2020-05-28 ENCOUNTER — Other Ambulatory Visit: Payer: Self-pay | Admitting: Obstetrics & Gynecology

## 2020-05-28 DIAGNOSIS — R928 Other abnormal and inconclusive findings on diagnostic imaging of breast: Secondary | ICD-10-CM

## 2020-06-18 ENCOUNTER — Other Ambulatory Visit: Payer: Self-pay

## 2020-06-18 ENCOUNTER — Ambulatory Visit
Admission: RE | Admit: 2020-06-18 | Discharge: 2020-06-18 | Disposition: A | Discharge status: Home / Self Care | Payer: Managed Care, Other (non HMO) | Source: Ambulatory Visit | Attending: Obstetrics & Gynecology | Admitting: Obstetrics & Gynecology

## 2020-06-18 ENCOUNTER — Other Ambulatory Visit: Payer: Self-pay | Admitting: Obstetrics & Gynecology

## 2020-06-18 DIAGNOSIS — R928 Other abnormal and inconclusive findings on diagnostic imaging of breast: Secondary | ICD-10-CM

## 2020-06-18 DIAGNOSIS — N6489 Other specified disorders of breast: Secondary | ICD-10-CM

## 2020-06-20 ENCOUNTER — Other Ambulatory Visit: Payer: Self-pay

## 2020-06-20 ENCOUNTER — Ambulatory Visit
Admission: RE | Admit: 2020-06-20 | Discharge: 2020-06-20 | Disposition: A | Discharge status: Home / Self Care | Payer: BC Managed Care – PPO | Source: Ambulatory Visit | Attending: Obstetrics & Gynecology | Admitting: Obstetrics & Gynecology

## 2020-06-20 DIAGNOSIS — N6489 Other specified disorders of breast: Secondary | ICD-10-CM

## 2020-06-20 HISTORY — PX: BREAST BIOPSY: SHX20

## 2020-07-18 ENCOUNTER — Ambulatory Visit: Payer: Self-pay | Admitting: General Surgery

## 2020-07-18 DIAGNOSIS — N6489 Other specified disorders of breast: Secondary | ICD-10-CM

## 2020-07-21 ENCOUNTER — Other Ambulatory Visit: Payer: Self-pay | Admitting: General Surgery

## 2020-07-21 DIAGNOSIS — N6489 Other specified disorders of breast: Secondary | ICD-10-CM

## 2020-07-29 ENCOUNTER — Other Ambulatory Visit: Payer: Self-pay

## 2020-07-29 ENCOUNTER — Encounter (HOSPITAL_BASED_OUTPATIENT_CLINIC_OR_DEPARTMENT_OTHER): Payer: Self-pay | Admitting: General Surgery

## 2020-08-13 NOTE — Progress Notes (Signed)

## 2020-08-14 ENCOUNTER — Other Ambulatory Visit: Payer: Self-pay | Admitting: General Surgery

## 2020-08-14 ENCOUNTER — Ambulatory Visit
Admission: RE | Admit: 2020-08-14 | Discharge: 2020-08-14 | Disposition: A | Discharge status: Home / Self Care | Payer: BC Managed Care – PPO | Source: Ambulatory Visit | Attending: General Surgery | Admitting: General Surgery

## 2020-08-14 ENCOUNTER — Other Ambulatory Visit: Payer: Self-pay

## 2020-08-14 DIAGNOSIS — R928 Other abnormal and inconclusive findings on diagnostic imaging of breast: Secondary | ICD-10-CM

## 2020-08-14 DIAGNOSIS — N6489 Other specified disorders of breast: Secondary | ICD-10-CM

## 2020-08-14 DIAGNOSIS — C50919 Malignant neoplasm of unspecified site of unspecified female breast: Secondary | ICD-10-CM

## 2020-08-14 HISTORY — DX: Malignant neoplasm of unspecified site of unspecified female breast: C50.919

## 2020-08-14 HISTORY — PX: BREAST BIOPSY: SHX20

## 2020-08-14 NOTE — Progress Notes (Signed)
Chart reviewed by Dr. Sabra Heck, ok to proceed as planned with surgery at [facility].

## 2020-08-15 ENCOUNTER — Ambulatory Visit (HOSPITAL_BASED_OUTPATIENT_CLINIC_OR_DEPARTMENT_OTHER): Admission: RE | Admit: 2020-08-15 | Payer: BC Managed Care – PPO | Source: Home / Self Care | Admitting: General Surgery

## 2020-08-15 ENCOUNTER — Ambulatory Visit
Admission: RE | Admit: 2020-08-15 | Discharge: 2020-08-15 | Disposition: A | Discharge status: Home / Self Care | Payer: BC Managed Care – PPO | Source: Ambulatory Visit | Attending: General Surgery | Admitting: General Surgery

## 2020-08-15 DIAGNOSIS — N6489 Other specified disorders of breast: Secondary | ICD-10-CM

## 2020-08-15 HISTORY — DX: Other complications of anesthesia, initial encounter: T88.59XA

## 2020-08-15 HISTORY — DX: Other specified postprocedural states: Z98.890

## 2020-08-15 HISTORY — DX: Nausea with vomiting, unspecified: R11.2

## 2020-08-15 SURGERY — BREAST LUMPECTOMY WITH RADIOACTIVE SEED LOCALIZATION
Anesthesia: General | Site: Breast | Laterality: Left

## 2020-08-20 ENCOUNTER — Ambulatory Visit: Payer: Self-pay | Admitting: General Surgery

## 2020-08-20 DIAGNOSIS — N6489 Other specified disorders of breast: Secondary | ICD-10-CM

## 2020-08-20 DIAGNOSIS — D0512 Intraductal carcinoma in situ of left breast: Secondary | ICD-10-CM

## 2020-08-21 ENCOUNTER — Telehealth: Payer: Self-pay | Admitting: Hematology and Oncology

## 2020-08-21 NOTE — Telephone Encounter (Signed)
Received a new pt referral from Dr. Marlou Starks for high grade dcis. Ms. Marisa Golden has been cld and scheduled to see Dr. Lindi Adie on 7/13 at 4:15pm.

## 2020-08-25 NOTE — Progress Notes (Signed)
New Breast Cancer Diagnosis: Left Breast   Location and Extent of disease :left breast. Located in the medial left breast with a small area of complex sclerosing lesion also in the upper portion of the left breast.    Histology per Pathology Report: grade , DCIS with necrosis 08/14/2020  Receptor Status: ER(negative), PR (negative), Her2-neu (), Ki-(%)  Surgeon and surgical plan, if any:  Dr. Marlou Starks 08/20/2020 - She favors breast conservation. -She will need two localized lumpectomies to remove both areas. -I will go ahead and refer her to medical and radiation oncology to discuss adjuvant therapy. -Left Lumpectomy with radioactive seed localization 09/19/2020 -Return two weeks after surgery.  Medical oncologist, treatment if any:   Dr. Lindi Adie 08/27/2020  Family History of Breast/Ovarian/Prostate Cancer: Maternal Aunt had breast cancer.  Lymphedema issues, if any:  No     Pain issues, if any: None noted     SAFETY ISSUES: Prior radiation? No Pacemaker/ICD? No Possible current pregnancy? Postmenopausal Is the patient on methotrexate? No  Current Complaints / other details:

## 2020-08-26 NOTE — Progress Notes (Signed)
[facility] CONSULT NOTE  Patient Care Team: Lennie Odor, [place] as PCP - General (Nurse Practitioner)  CHIEF COMPLAINTS/PURPOSE OF CONSULTATION:  Newly diagnosed DCIS of the left breast  HISTORY OF PRESENTING ILLNESS:  Marisa Golden 65 y.o. female is here because of recent diagnosis of DCIS of the left breast. Diagnostic mammogram and [place] on 06/18/20 showed persistent subtle architectural distortion involving the UIQ of the left breast and no pathologic left axillary lymphadenopathy. Biopsy on 08/14/20 showed high grade DCIS with necrosis ER/PR-. She is scheduled for a left breast lumpectomy on 09/19/20 with Dr. Marlou Starks. She presents to the clinic today for initial evaluation and discussion of treatment options.   I reviewed her records extensively and collaborated the history with the patient.  SUMMARY OF ONCOLOGIC HISTORY: Oncology History Overview Note  Ductal carcinoma in situ of the left breast  Diagnostic mammogram and [place] on 06/18/20 showed persistent subtle architectural distortion involving the UIQ of the left breast and no pathologic left axillary lymphadenopathy. Biopsy on 08/14/20 showed high grade DCIS with necrosis ER/PR-.    Ductal carcinoma in situ (DCIS) of left breast  08/27/2020 Initial Diagnosis   Ductal carcinoma in situ (DCIS) of left breast    08/27/2020 Cancer Staging   Staging form: Breast, AJCC 8th Edition - Clinical stage from 08/27/2020: Stage 0 (cTis (DCIS), cN0, cM0, G3, ER-, PR-, HER2: Not Assessed) - Signed by Nicholas Lose, MD on 08/27/2020  Stage prefix: Initial diagnosis  Histologic grading system: 3 grade system      MEDICAL HISTORY:  Past Medical History:  Diagnosis Date   Allergy    Arthritis    KNEES,NECK   Blood transfusion without reported diagnosis    Breast cancer ([facility]) 57/84/6962   Complication of anesthesia    COPD (chronic obstructive pulmonary disease) (HCC)    Multiple allergies    radweed, pollen, grasses    Pneumothorax    x 4- first happened in 1980   PONV (postoperative nausea and vomiting)    Seizures ([place])    age 23 had 43 sz with a fall but never again     SURGICAL HISTORY: Past Surgical History:  Procedure Laterality Date   CARPAL TUNNEL RELEASE     left wrist   COLONOSCOPY     POLYPECTOMY     THORACOTOMY  1983   TUBAL LIGATION      SOCIAL HISTORY: Social History   Socioeconomic History   Marital status: Married    Spouse name: Not on file   Number of children: Not on file   Years of education: Not on file   Highest education level: Not on file  Occupational History   Not on file  Tobacco Use   Smoking status: Every Day    Packs/day: 1.00    Years: 40.00    Pack years: 40.00    Types: Cigarettes   Smokeless tobacco: Never  Substance and Sexual Activity   Alcohol use: No    Alcohol/week: 0.0 standard drinks   Drug use: Yes    Types: Marijuana    Comment: 07/26/20   Sexual activity: Not on file  Other Topics Concern   Not on file  Social History Narrative   Not on file   Social Determinants of Health   Financial Resource Strain: Not on file  Food Insecurity: Not on file  Transportation Needs: Not on file  Physical Activity: Not on file  Stress: Not on file  Social Connections: Not on file  Intimate Partner Violence: Not At Risk   Fear of Current or Ex-Partner: No   Emotionally Abused: No   Physically Abused: No   Sexually Abused: No    FAMILY HISTORY: Family History  Problem Relation Age of Onset   Colon polyps Father    Breast cancer Maternal Aunt    Colon cancer Neg Hx    Rectal cancer Neg Hx    Stomach cancer Neg Hx     ALLERGIES:  is allergic to morphine and related.  MEDICATIONS:  Current Outpatient Medications  Medication Sig Dispense Refill   Black Cohosh 450 MG CAPS Take 1 capsule by mouth daily.     cholecalciferol (VITAMIN D) 1000 UNITS tablet Take 1,000 Units by mouth daily.     Dextromethorphan-Guaifenesin (MUCINEX DM PO) Take  1 tablet by mouth 2 (two) times daily as needed.      docusate sodium (COLACE) 100 MG capsule Take 100 mg by mouth daily.       levocetirizine (XYZAL) 2.5 MG/5ML solution Take 2.5 mg by mouth every evening.     Multiple Vitamins-Minerals (MULTIVITAMIN WITH MINERALS) tablet Take 1 tablet by mouth daily.       POTASSIUM CHLORIDE PO Take 10 mEq by mouth.     Current Facility-Administered Medications  Medication Dose Route Frequency Provider Last Rate Last Admin   0.9 %  sodium chloride infusion  500 mL Intravenous Continuous Lafayette Dragon, MD        REVIEW OF SYSTEMS:   Constitutional: Denies fevers, chills or abnormal night sweats Eyes: Denies blurriness of vision, double vision or watery eyes Ears, nose, mouth, throat, and face: Denies mucositis or sore throat Respiratory: Denies cough, dyspnea or wheezes Cardiovascular: Denies palpitation, chest discomfort or lower extremity swelling Gastrointestinal:  Denies nausea, heartburn or change in bowel habits Skin: Denies abnormal skin rashes Lymphatics: Denies new lymphadenopathy or easy bruising Neurological:Denies numbness, tingling or new weaknesses Behavioral/Psych: Mood is stable, no new changes  Breast:  Denies any palpable lumps or discharge All other systems were reviewed with the patient and are negative.  PHYSICAL EXAMINATION: ECOG PERFORMANCE STATUS: 1 - Symptomatic but completely ambulatory  Vitals:   08/27/20 1623  BP: 119/77  Pulse: 80  Resp: 18  Temp: 97.7 F (36.5 C)  SpO2: 98%   Filed Weights   08/27/20 1623  Weight: 109 lb 14.4 oz (49.9 kg)       LABORATORY DATA:  I have reviewed the data as listed Lab Results  Component Value Date   WBC 8.3 12/31/2013   HGB 14.3 12/31/2013   HCT 44.2 12/31/2013   MCV 87.5 12/31/2013   PLT 194.0 12/31/2013   No results found for: NA, K, CL, CO2  RADIOGRAPHIC STUDIES: I have personally reviewed the radiological reports and agreed with the findings in the  report.  ASSESSMENT AND PLAN:  Ductal carcinoma in situ (DCIS) of left breast Screening mammogram detected architectural distortion left breast upper inner quadrant, biopsy revealed high-grade DCIS ER 0%, PR 0%  Pathology review: I discussed with the patient the difference between DCIS and invasive breast cancer. It is considered a precancerous lesion. DCIS is classified as a 0. It is generally detected through mammograms as calcifications. We discussed the significance of grades and its impact on prognosis. We also discussed the importance of ER and PR receptors and their implications to adjuvant treatment options. Prognosis of DCIS dependence on grade, comedo necrosis. It is anticipated that if not treated, 20-30% of DCIS can develop  into invasive breast cancer.  Recommendation: 1. Breast conserving surgery 2. Followed by adjuvant radiation therapy  No role of adjuvant antiestrogen therapy since she is ER/PR negative  Return to clinic after surgery to discuss the final pathology report and come up with an adjuvant treatment plan. If the final pathology does not show any evidence of invasive breast cancer then we will not need to see her in the future. I will set her up for a MyChart virtual visit 1 week after surgery. 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  She takes care of her mom at a nursing home.  She has a son and a daughter.  She has 5 grandchildren.  All questions were answered. The patient knows to call the clinic with any problems, questions or concerns.   Rulon Eisenmenger, MD, MPH 08/27/2020    I, Thana Ates, am acting as scribe for Nicholas Lose, MD.  I have reviewed the above documentation for accuracy and completeness, and I agree with the above.

## 2020-08-27 ENCOUNTER — Ambulatory Visit
Admission: RE | Admit: 2020-08-27 | Discharge: 2020-08-27 | Disposition: A | Discharge status: Home / Self Care | Payer: BC Managed Care – PPO | Source: Ambulatory Visit | Attending: Radiation Oncology | Admitting: Radiation Oncology

## 2020-08-27 ENCOUNTER — Encounter: Payer: Self-pay | Admitting: Radiation Oncology

## 2020-08-27 ENCOUNTER — Inpatient Hospital Stay: Payer: BC Managed Care – PPO | Attending: Hematology and Oncology | Admitting: Hematology and Oncology

## 2020-08-27 ENCOUNTER — Other Ambulatory Visit: Payer: Self-pay

## 2020-08-27 VITALS — BP 130/82 | HR 90 | Temp 98.3°F | Resp 16 | Ht 65.0 in | Wt 109.6 lb

## 2020-08-27 DIAGNOSIS — Z79899 Other long term (current) drug therapy: Secondary | ICD-10-CM | POA: Diagnosis not present

## 2020-08-27 DIAGNOSIS — Z803 Family history of malignant neoplasm of breast: Secondary | ICD-10-CM | POA: Insufficient documentation

## 2020-08-27 DIAGNOSIS — J449 Chronic obstructive pulmonary disease, unspecified: Secondary | ICD-10-CM | POA: Diagnosis not present

## 2020-08-27 DIAGNOSIS — M129 Arthropathy, unspecified: Secondary | ICD-10-CM | POA: Insufficient documentation

## 2020-08-27 DIAGNOSIS — Z800 Family history of malignant neoplasm of digestive organs: Secondary | ICD-10-CM | POA: Insufficient documentation

## 2020-08-27 DIAGNOSIS — F17210 Nicotine dependence, cigarettes, uncomplicated: Secondary | ICD-10-CM | POA: Diagnosis not present

## 2020-08-27 DIAGNOSIS — D0512 Intraductal carcinoma in situ of left breast: Secondary | ICD-10-CM

## 2020-08-27 DIAGNOSIS — Z171 Estrogen receptor negative status [ER-]: Secondary | ICD-10-CM | POA: Diagnosis not present

## 2020-08-27 NOTE — Assessment & Plan Note (Signed)
Screening mammogram detected architectural distortion left breast upper inner quadrant, biopsy revealed high-grade DCIS ER 0%, PR 0%  Pathology review: I discussed with the patient the difference between DCIS and invasive breast cancer. It is considered a precancerous lesion. DCIS is classified as a 0. It is generally detected through mammograms as calcifications. We discussed the significance of grades and its impact on prognosis. We also discussed the importance of ER and PR receptors and their implications to adjuvant treatment options. Prognosis of DCIS dependence on grade, comedo necrosis. It is anticipated that if not treated, 20-30% of DCIS can develop into invasive breast cancer.  Recommendation: 1. Breast conserving surgery 2. Followed by adjuvant radiation therapy  Return to clinic after surgery to discuss the final pathology report and come up with an adjuvant treatment plan.

## 2020-08-27 NOTE — Progress Notes (Signed)
Radiation Oncology         [phone] ________________________________  Name: Marisa Golden        MRN: [id number]  Date of Service: 08/27/2020 DOB: 18-May-1955  [place]:OVZCHY, [place], [place]  Jovita Kussmaul, MD     REFERRING PHYSICIAN: Autumn Messing III, MD   DIAGNOSIS: There were no encounter diagnoses.   HISTORY OF PRESENT ILLNESS: Marisa Golden is a 65 y.o. female seen for a new diagnosis of left breast cancer. The patient was being followed with diagnostic mammography given a screening recall finding in the left breast.  In 2019 no findings were noted of concern with spot compression. Her diagnostic mammogram on 06/18/2020 showed subtle persistent architectural change in the upper inner quadrant of the left breast without any ultrasound correlate so she underwent a stereotactic biopsy on 06/20/2020 which showed a complex sclerosing lesion with calcifications within the upper inner quadrant specimen.  She did have a newly noted finding adjacent to this and a stereotactic biopsy on 08/14/2020 showed high-grade DCIS with necrosis, her tumor was ER/PR negative.  She is scheduled to undergo lumpectomy on 10/14/2020 and is seen today to discuss treatment recommendations in the adjuvant setting.    PREVIOUS RADIATION THERAPY: No   PAST MEDICAL HISTORY:  Past Medical History:  Diagnosis Date   Allergy    Arthritis    KNEES,NECK   Blood transfusion without reported diagnosis    Complication of anesthesia    COPD (chronic obstructive pulmonary disease) ([place])    Multiple allergies    radweed, pollen, grasses   Pneumothorax    x 4- first happened in 1980   PONV (postoperative nausea and vomiting)    Seizures ([place])    age 49 had 72 sz with a fall but never again        PAST SURGICAL HISTORY: Past Surgical History:  Procedure Laterality Date   CARPAL TUNNEL RELEASE     left wrist   COLONOSCOPY     POLYPECTOMY     THORACOTOMY  1983   TUBAL LIGATION       FAMILY HISTORY:  Family History   Problem Relation Age of Onset   Breast cancer Maternal Aunt    Colon polyps Father    Colon cancer Neg Hx    Rectal cancer Neg Hx    Stomach cancer Neg Hx      SOCIAL HISTORY:  reports that she has been smoking cigarettes. She has a 40.00 pack-year smoking history. She has never used smokeless tobacco. She reports current drug use. Drug: Marijuana. She reports that she does not drink alcohol.   ALLERGIES: Morphine and related   MEDICATIONS:  Current Outpatient Medications  Medication Sig Dispense Refill   Black Cohosh 450 MG CAPS Take 1 capsule by mouth daily.     cholecalciferol (VITAMIN D) 1000 UNITS tablet Take 1,000 Units by mouth daily.     Dextromethorphan-Guaifenesin (MUCINEX DM PO) Take 1 tablet by mouth 2 (two) times daily as needed.      docusate sodium (COLACE) 100 MG capsule Take 100 mg by mouth daily.       levocetirizine (XYZAL) 2.5 MG/5ML solution Take 2.5 mg by mouth every evening.     Multiple Vitamins-Minerals (MULTIVITAMIN WITH MINERALS) tablet Take 1 tablet by mouth daily.       naproxen sodium (ANAPROX) 220 MG tablet Take 220 mg by mouth 2 (two) times daily with a meal.     Current Facility-Administered Medications  Medication Dose Route  Frequency Provider Last Rate Last Admin   0.9 %  sodium chloride infusion  500 mL Intravenous Continuous Lafayette Dragon, MD         REVIEW OF SYSTEMS: On review of systems, the patient reports that she is doing well overall. She reports no pain at this time or other concerns with her breast.     PHYSICAL EXAM:  Wt Readings from Last 3 Encounters:  07/25/15 121 lb (54.9 kg)  07/10/15 121 lb 9.6 oz (55.2 kg)  05/07/15 124 lb 3.2 oz (56.3 kg)   Temp Readings from Last 3 Encounters:  07/25/15 97.8 F (36.6 C) (Other (Comment))  05/07/15 98.9 F (37.2 C) (Oral)  09/01/13 98 F (36.7 C) (Oral)   BP Readings from Last 3 Encounters:  07/25/15 122/81  05/07/15 114/82  12/31/13 106/60   Pulse Readings from Last 3  Encounters:  07/25/15 69  05/07/15 82  12/31/13 80    In general this is a well appearing caucasian female in no acute distress. She's alert and oriented x4 and appropriate throughout the examination. Cardiopulmonary assessment is negative for acute distress and she exhibits normal effort. Bilateral breast exam is deferred.    ECOG = 0  0 - Asymptomatic (Fully active, able to carry on all predisease activities without restriction)  1 - Symptomatic but completely ambulatory (Restricted in physically strenuous activity but ambulatory and able to carry out work of a light or sedentary nature. For example, light housework, office work)  2 - Symptomatic, <50% in bed during the day (Ambulatory and capable of all self care but unable to carry out any work activities. Up and about more than 50% of waking hours)  3 - Symptomatic, >50% in bed, but not bedbound (Capable of only limited self-care, confined to bed or chair 50% or more of waking hours)  4 - Bedbound (Completely disabled. Cannot carry on any self-care. Totally confined to bed or chair)  5 - Death   Eustace Pen MM, Creech RH, Tormey DC, et al. [phone]). "Toxicity and response criteria of the [facility]". [place] Oncol. 5 (6): 649-55    LABORATORY DATA:  Lab Results  Component Value Date   WBC 8.3 12/31/2013   HGB 14.3 12/31/2013   HCT 44.2 12/31/2013   MCV 87.5 12/31/2013   PLT 194.0 12/31/2013   No results found for: NA, K, CL, CO2 No results found for: ALT, AST, GGT, ALKPHOS, BILITOT    RADIOGRAPHY: MM CLIP PLACEMENT LEFT  Result Date: 08/14/2020 CLINICAL DATA:  Patient is status post stereotactic guided core needle biopsy distortion medial left breast. EXAM: 3D DIAGNOSTIC LEFT MAMMOGRAM POST STEREOTACTIC BIOPSY COMPARISON:  Previous exam(s). FINDINGS: 3D Mammographic images were obtained following stereotactic guided biopsy of medial left breast distortion. The ribbon shaped marking clip is located  approximately 7 mm lateral to the site of biopsy distortion. The coil shaped clip from prior biopsy site within the superior left breast middle depth appears to be located at the site of biopsy with surrounding distortion. IMPRESSION: Approximate 7 mm lateral migration of the ribbon shaped clip after stereotactic guided core needle biopsy of medial left breast distortion. The coil shaped marking clip is located within the appropriate position, at the site of prior biopsy, surrounded by focal distortion. Final Assessment: Post Procedure Mammograms for Marker Placement Electronically Signed   By: Lovey Newcomer M.D.   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 This was found to be concordant by Dr. Lovey Newcomer. Pathology results were discussed with the patient by telephone. The patient reported doing well after the biopsy with tenderness at the site. Post biopsy instructions and care were reviewed and questions were answered. The patient was encouraged to call [facility] for any additional concerns. Surgical consultation has been arranged with Dr. Autumn Messing at [facility] on August 20, 2020. Recommend breast MRI given high grade histology. Treatment plan for recently diagnosed left breast malignancy as well as for previously diagnosed left breast complex sclerosing lesion. Pathology results reported by Stacie Acres RN on 08/19/2020. Electronically Signed   By: Lovey Newcomer M.D.   On: 08/19/2020 08:16   Result Date: 08/19/2020 CLINICAL DATA:  Patient presented today for RSL localization of prior biopsy site which demonstrated complex sclerosing lesion (coil clip). Pre RSL images of the left breast were obtained and it became clear that the site which was biopsied on  06/20/2020 was appropriately marked with the coil clip and there was no migration. The originally evaluated subtle distortion within the medial posterior left breast does not appear to have been sampled on prior stereo biopsy. Biopsy of this second area is recommended prior to surgery for the recently diagnosed CSL left breast. EXAM: LEFT BREAST STEREOTACTIC CORE NEEDLE BIOPSY COMPARISON:  Previous exams. FINDINGS: The patient and I discussed the procedure of stereotactic-guided biopsy including benefits and alternatives. We discussed the high likelihood of a successful procedure. We discussed the risks of the procedure including infection, bleeding, tissue injury, clip migration, and inadequate sampling. Informed written consent was given. The usual time out protocol was performed immediately prior to the procedure. Using sterile technique and 1% Lidocaine as local anesthetic, under stereotactic guidance, a 9 gauge vacuum assisted device was used to perform core needle biopsy of distortion within the medial left breast using a medial approach. Lesion quadrant: Upper inner quadrant At the conclusion of the procedure, ribbon tissue marker clip was deployed into the biopsy cavity. Follow-up 2-view mammogram was performed and dictated separately. IMPRESSION: Stereotactic-guided biopsy of left breast distortion. No apparent complications. Electronically Signed: By: Lovey Newcomer M.D. On: 08/14/2020 15:55       IMPRESSION/PLAN: 1. 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 Dr. Lisbeth Renshaw discusses the pathology findings and reviews the nature of early stage breast disease and CSL as a risk factor. The consensus from the breast conference includes breast conservation with lumpectomy. Dr. Lindi Adie will see the patient today and will likely recommend adjuvant antiestrogen following external radiotherapy. Dr. Lisbeth Renshaw discusses the utility of radiotherapy to the breast  to reduce risks of local recurrence followed by  antiestrogen therapy. We discussed the risks, benefits, short, and long term effects of radiotherapy, as well as the curative intent, and the patient is interested in proceeding. Dr. Lisbeth Renshaw discusses the delivery and logistics of radiotherapy and anticipates a course of 4 weeks of radiotherapy to the left breast with deep inspiration breath hold technique. We will see her back a few weeks after surgery to discuss the simulation process and anticipate we starting radiotherapy about 4-6 weeks after surgery.    In a visit lasting 60 minutes, greater than 50% of the time was spent face to face reviewing her case, as well as in preparation of,  discussing, and coordinating the patient's care.  The above documentation reflects my direct findings during this shared patient visit. Please see the separate note by Dr. Lisbeth Renshaw on this date for the remainder of the patient's plan of care.    Carola Rhine, [facility]    **Disclaimer: This note was dictated with voice recognition software. Similar sounding words can inadvertently be transcribed and this note may contain transcription errors which may not have been corrected upon publication of note.**

## 2020-08-28 ENCOUNTER — Encounter: Payer: Self-pay | Admitting: *Deleted

## 2020-08-28 ENCOUNTER — Other Ambulatory Visit: Payer: Self-pay | Admitting: General Surgery

## 2020-08-28 DIAGNOSIS — N6489 Other specified disorders of breast: Secondary | ICD-10-CM

## 2020-08-28 DIAGNOSIS — D0512 Intraductal carcinoma in situ of left breast: Secondary | ICD-10-CM

## 2020-09-02 ENCOUNTER — Encounter: Payer: Self-pay | Admitting: Licensed Clinical Social Worker

## 2020-09-02 NOTE — Progress Notes (Signed)
[place] Work  Clinical Social Work was referred by new patient protocol for assessment of psychosocial needs.  Clinical Social Worker contacted patient by phone  to offer support and assess for needs.    Patient reports doing fairly well right now as she awaits her surgery on 8/5.  She has strong support from her family and prayer warriors and credits her faith with helping her cope.  Pt denies any transportation or resource needs at this time.  CSW informed patient of the support team and support services at [facility].  CSW provided contact information and encouraged patient to call with any questions or concerns.   [place], [place] Worker [employer]

## 2020-09-11 ENCOUNTER — Other Ambulatory Visit: Payer: Self-pay

## 2020-09-11 ENCOUNTER — Encounter (HOSPITAL_BASED_OUTPATIENT_CLINIC_OR_DEPARTMENT_OTHER): Payer: Self-pay | Admitting: General Surgery

## 2020-09-18 ENCOUNTER — Ambulatory Visit
Admission: RE | Admit: 2020-09-18 | Discharge: 2020-09-18 | Disposition: A | Discharge status: Home / Self Care | Payer: BC Managed Care – PPO | Source: Ambulatory Visit | Attending: General Surgery | Admitting: General Surgery

## 2020-09-18 ENCOUNTER — Other Ambulatory Visit: Payer: Self-pay

## 2020-09-18 DIAGNOSIS — D0512 Intraductal carcinoma in situ of left breast: Secondary | ICD-10-CM

## 2020-09-18 DIAGNOSIS — N6489 Other specified disorders of breast: Secondary | ICD-10-CM

## 2020-09-19 ENCOUNTER — Ambulatory Visit
Admission: RE | Admit: 2020-09-19 | Discharge: 2020-09-19 | Disposition: A | Discharge status: Home / Self Care | Payer: BC Managed Care – PPO | Source: Ambulatory Visit | Attending: General Surgery | Admitting: General Surgery

## 2020-09-19 ENCOUNTER — Encounter (HOSPITAL_BASED_OUTPATIENT_CLINIC_OR_DEPARTMENT_OTHER)
Admission: RE | Disposition: A | Discharge status: Home / Self Care | Payer: Self-pay | Source: Home / Self Care | Attending: General Surgery

## 2020-09-19 ENCOUNTER — Encounter (HOSPITAL_BASED_OUTPATIENT_CLINIC_OR_DEPARTMENT_OTHER): Payer: Self-pay | Admitting: General Surgery

## 2020-09-19 ENCOUNTER — Ambulatory Visit (HOSPITAL_BASED_OUTPATIENT_CLINIC_OR_DEPARTMENT_OTHER): Payer: BC Managed Care – PPO | Admitting: Anesthesiology

## 2020-09-19 ENCOUNTER — Ambulatory Visit (HOSPITAL_BASED_OUTPATIENT_CLINIC_OR_DEPARTMENT_OTHER)
Admission: RE | Admit: 2020-09-19 | Discharge: 2020-09-19 | Disposition: A | Discharge status: Home / Self Care | Payer: BC Managed Care – PPO | Attending: General Surgery | Admitting: General Surgery

## 2020-09-19 ENCOUNTER — Other Ambulatory Visit: Payer: Self-pay

## 2020-09-19 DIAGNOSIS — N641 Fat necrosis of breast: Secondary | ICD-10-CM | POA: Insufficient documentation

## 2020-09-19 DIAGNOSIS — Z885 Allergy status to narcotic agent status: Secondary | ICD-10-CM | POA: Diagnosis not present

## 2020-09-19 DIAGNOSIS — N6489 Other specified disorders of breast: Secondary | ICD-10-CM | POA: Diagnosis not present

## 2020-09-19 DIAGNOSIS — D0512 Intraductal carcinoma in situ of left breast: Secondary | ICD-10-CM | POA: Insufficient documentation

## 2020-09-19 HISTORY — PX: BREAST LUMPECTOMY WITH RADIOACTIVE SEED LOCALIZATION: SHX6424

## 2020-09-19 SURGERY — BREAST LUMPECTOMY WITH RADIOACTIVE SEED LOCALIZATION
Anesthesia: General | Site: Breast | Laterality: Left

## 2020-09-19 MED ORDER — SCOPOLAMINE 1 MG/3DAYS TD PT72
1.0000 | MEDICATED_PATCH | TRANSDERMAL | Status: DC
  Administered 2020-09-19: 1.5 mg via TRANSDERMAL

## 2020-09-19 MED ORDER — CHLORHEXIDINE GLUCONATE CLOTH 2 % EX PADS: 6.0000 | MEDICATED_PAD | Freq: Once | CUTANEOUS | Status: DC

## 2020-09-19 MED ORDER — GABAPENTIN 300 MG PO CAPS
ORAL_CAPSULE | ORAL | Status: AC
  Filled 2020-09-19: qty 1

## 2020-09-19 MED ORDER — CELECOXIB 200 MG PO CAPS: 200.0000 mg | ORAL_CAPSULE | ORAL | Status: DC

## 2020-09-19 MED ORDER — PROPOFOL 10 MG/ML IV BOLUS
INTRAVENOUS | Status: DC | PRN
Start: 2020-09-19 — End: 2020-09-19
  Administered 2020-09-19: 50 mg via INTRAVENOUS

## 2020-09-19 MED ORDER — ACETAMINOPHEN 500 MG PO TABS
ORAL_TABLET | ORAL | Status: AC
  Filled 2020-09-19: qty 2

## 2020-09-19 MED ORDER — FENTANYL CITRATE (PF) 100 MCG/2ML IJ SOLN
INTRAMUSCULAR | Status: DC | PRN
  Administered 2020-09-19: 50 ug via INTRAVENOUS

## 2020-09-19 MED ORDER — MIDAZOLAM HCL 5 MG/5ML IJ SOLN
INTRAMUSCULAR | Status: DC | PRN
  Administered 2020-09-19: 2 mg via INTRAVENOUS

## 2020-09-19 MED ORDER — FENTANYL CITRATE (PF) 100 MCG/2ML IJ SOLN
25.0000 ug | INTRAMUSCULAR | Status: DC | PRN
  Administered 2020-09-19: 50 ug via INTRAVENOUS

## 2020-09-19 MED ORDER — BUPIVACAINE-EPINEPHRINE (PF) 0.25% -1:200000 IJ SOLN
INTRAMUSCULAR | Status: AC
  Filled 2020-09-19: qty 30

## 2020-09-19 MED ORDER — EPHEDRINE SULFATE 50 MG/ML IJ SOLN
INTRAMUSCULAR | Status: DC | PRN
  Administered 2020-09-19: 15 mg via INTRAVENOUS

## 2020-09-19 MED ORDER — OXYCODONE HCL 5 MG PO TABS: 5.0000 mg | ORAL_TABLET | Freq: Once | ORAL | Status: DC | PRN

## 2020-09-19 MED ORDER — MEPERIDINE HCL 25 MG/ML IJ SOLN: 6.2500 mg | INTRAMUSCULAR | Status: DC | PRN

## 2020-09-19 MED ORDER — MIDAZOLAM HCL 2 MG/2ML IJ SOLN
INTRAMUSCULAR | Status: AC
  Filled 2020-09-19: qty 2

## 2020-09-19 MED ORDER — LIDOCAINE HCL (CARDIAC) PF 100 MG/5ML IV SOSY
PREFILLED_SYRINGE | INTRAVENOUS | Status: DC | PRN
  Administered 2020-09-19: 50 mg via INTRAVENOUS

## 2020-09-19 MED ORDER — ACETAMINOPHEN 500 MG PO TABS
1000.0000 mg | ORAL_TABLET | ORAL | Status: AC
  Administered 2020-09-19: 1000 mg via ORAL

## 2020-09-19 MED ORDER — FENTANYL CITRATE (PF) 100 MCG/2ML IJ SOLN
INTRAMUSCULAR | Status: AC
  Filled 2020-09-19: qty 2

## 2020-09-19 MED ORDER — TRAMADOL HCL 50 MG PO TABS: 50.0000 mg | ORAL_TABLET | Freq: Four times a day (QID) | ORAL | 0 refills | Status: DC | PRN

## 2020-09-19 MED ORDER — LACTATED RINGERS IV SOLN: INTRAVENOUS | Status: DC

## 2020-09-19 MED ORDER — BUPIVACAINE-EPINEPHRINE (PF) 0.25% -1:200000 IJ SOLN
INTRAMUSCULAR | Status: DC | PRN
  Administered 2020-09-19: 20 mL

## 2020-09-19 MED ORDER — CELECOXIB 200 MG PO CAPS
200.0000 mg | ORAL_CAPSULE | ORAL | Status: AC
  Administered 2020-09-19: 200 mg via ORAL

## 2020-09-19 MED ORDER — CEFAZOLIN SODIUM-DEXTROSE 2-4 GM/100ML-% IV SOLN
2.0000 g | INTRAVENOUS | Status: AC
Start: 2020-09-19 — End: 2020-09-19
  Administered 2020-09-19: 2 g via INTRAVENOUS

## 2020-09-19 MED ORDER — PROMETHAZINE HCL 25 MG/ML IJ SOLN: 6.2500 mg | INTRAMUSCULAR | Status: DC | PRN

## 2020-09-19 MED ORDER — CEFAZOLIN SODIUM-DEXTROSE 2-4 GM/100ML-% IV SOLN: 2.0000 g | INTRAVENOUS | Status: DC

## 2020-09-19 MED ORDER — GABAPENTIN 300 MG PO CAPS: 300.0000 mg | ORAL_CAPSULE | ORAL | Status: DC

## 2020-09-19 MED ORDER — CELECOXIB 200 MG PO CAPS
ORAL_CAPSULE | ORAL | Status: AC
  Filled 2020-09-19: qty 1

## 2020-09-19 MED ORDER — CEFAZOLIN SODIUM-DEXTROSE 2-4 GM/100ML-% IV SOLN
INTRAVENOUS | Status: AC
  Filled 2020-09-19: qty 100

## 2020-09-19 MED ORDER — ACETAMINOPHEN 500 MG PO TABS: 1000.0000 mg | ORAL_TABLET | ORAL | Status: DC

## 2020-09-19 MED ORDER — OXYCODONE HCL 5 MG/5ML PO SOLN
5.0000 mg | Freq: Once | ORAL | Status: DC | PRN
Start: 2020-09-19 — End: 2020-09-19

## 2020-09-19 MED ORDER — DEXAMETHASONE SODIUM PHOSPHATE 4 MG/ML IJ SOLN
INTRAMUSCULAR | Status: DC | PRN
  Administered 2020-09-19: 10 mg via INTRAVENOUS

## 2020-09-19 MED ORDER — GABAPENTIN 300 MG PO CAPS
300.0000 mg | ORAL_CAPSULE | ORAL | Status: AC
  Administered 2020-09-19: 300 mg via ORAL

## 2020-09-19 SURGICAL SUPPLY — 46 items
ADH SKN CLS APL DERMABOND .7 (GAUZE/BANDAGES/DRESSINGS) ×1
APL PRP STRL LF DISP 70% ISPRP (MISCELLANEOUS) ×1
APPLIER CLIP 9.375 MED OPEN (MISCELLANEOUS) ×2
APR CLP MED 9.3 20 MLT OPN (MISCELLANEOUS) ×1
BINDER BREAST LRG (GAUZE/BANDAGES/DRESSINGS) ×1 IMPLANT
BLADE SURG 15 STRL LF DISP TIS (BLADE) ×1 IMPLANT
BLADE SURG 15 STRL SS (BLADE) ×2
CANISTER SUC SOCK COL 7IN (MISCELLANEOUS) ×2 IMPLANT
CANISTER SUCT 1200ML W/VALVE (MISCELLANEOUS) ×2 IMPLANT
CHLORAPREP W/TINT 26 (MISCELLANEOUS) ×2 IMPLANT
CLIP APPLIE 9.375 MED OPEN (MISCELLANEOUS) IMPLANT
COVER BACK TABLE 60X90IN (DRAPES) ×2 IMPLANT
COVER MAYO STAND STRL (DRAPES) ×2 IMPLANT
COVER PROBE W GEL 5X96 (DRAPES) ×2 IMPLANT
DECANTER SPIKE VIAL GLASS SM (MISCELLANEOUS) IMPLANT
DERMABOND ADVANCED (GAUZE/BANDAGES/DRESSINGS) ×1
DERMABOND ADVANCED .7 DNX12 (GAUZE/BANDAGES/DRESSINGS) ×1 IMPLANT
DRAPE LAPAROSCOPIC ABDOMINAL (DRAPES) ×2 IMPLANT
DRAPE UTILITY XL STRL (DRAPES) ×2 IMPLANT
ELECT COATED BLADE 2.86 ST (ELECTRODE) ×2 IMPLANT
ELECT REM PT RETURN 9FT ADLT (ELECTROSURGICAL) ×2
ELECTRODE REM PT RTRN 9FT ADLT (ELECTROSURGICAL) ×1 IMPLANT
GLOVE SURG ENC MOIS LTX SZ7.5 (GLOVE) ×4 IMPLANT
GLOVE SURG POLYISO LF SZ6.5 (GLOVE) ×1 IMPLANT
GLOVE SURG POLYISO LF SZ7 (GLOVE) ×1 IMPLANT
GLOVE SURG UNDER POLY LF SZ7 (GLOVE) ×3 IMPLANT
GOWN STRL REUS W/ TWL LRG LVL3 (GOWN DISPOSABLE) ×2 IMPLANT
GOWN STRL REUS W/TWL LRG LVL3 (GOWN DISPOSABLE) ×4
ILLUMINATOR WAVEGUIDE N/F (MISCELLANEOUS) IMPLANT
KIT MARKER MARGIN INK (KITS) ×2 IMPLANT
LIGHT WAVEGUIDE WIDE FLAT (MISCELLANEOUS) IMPLANT
NDL HYPO 25X1 1.5 SAFETY (NEEDLE) IMPLANT
NEEDLE HYPO 25X1 1.5 SAFETY (NEEDLE) IMPLANT
NS IRRIG 1000ML POUR BTL (IV SOLUTION) IMPLANT
PACK BASIN DAY SURGERY FS (CUSTOM PROCEDURE TRAY) ×2 IMPLANT
PENCIL SMOKE EVACUATOR (MISCELLANEOUS) ×2 IMPLANT
SLEEVE SCD COMPRESS KNEE MED (STOCKING) ×2 IMPLANT
SPONGE T-LAP 18X18 ~~LOC~~+RFID (SPONGE) ×2 IMPLANT
SUT MON AB 4-0 PC3 18 (SUTURE) ×2 IMPLANT
SUT SILK 2 0 SH (SUTURE) IMPLANT
SUT VICRYL 3-0 CR8 SH (SUTURE) ×2 IMPLANT
SYR CONTROL 10ML LL (SYRINGE) IMPLANT
TOWEL GREEN STERILE FF (TOWEL DISPOSABLE) ×2 IMPLANT
TRAY FAXITRON CT DISP (TRAY / TRAY PROCEDURE) ×2 IMPLANT
TUBE CONNECTING 20X1/4 (TUBING) ×2 IMPLANT
YANKAUER SUCT BULB TIP NO VENT (SUCTIONS) IMPLANT

## 2020-09-19 NOTE — Anesthesia Procedure Notes (Signed)
Procedure Name: LMA Insertion Date/Time: 09/19/2020 1:04 PM Performed by: Willa Frater, CRNA Pre-anesthesia Checklist: Patient identified, Emergency Drugs available, Suction available and Patient being monitored Patient Re-evaluated:Patient Re-evaluated prior to induction Oxygen Delivery Method: Circle system utilized Preoxygenation: Pre-oxygenation with 100% oxygen Induction Type: IV induction Ventilation: Mask ventilation without difficulty LMA: LMA inserted LMA Size: 3.0 Number of attempts: 1 Airway Equipment and Method: Bite block Placement Confirmation: positive ETCO2 Tube secured with: Tape Dental Injury: Teeth and Oropharynx as per pre-operative assessment

## 2020-09-19 NOTE — Interval H&P Note (Signed)
History and Physical Interval Note:  09/19/2020 12:36 PM  Marisa Golden  has presented today for surgery, with the diagnosis of LEFT BREAST DCIS, CSL.  The various methods of treatment have been discussed with the patient and family. After consideration of risks, benefits and other options for treatment, the patient has consented to  Procedure(s): LEFT BREAST LUMPECTOMY WITH RADIOACTIVE SEED LOCALIZATION X 2 (Left) as a surgical intervention.  The patient's history has been reviewed, patient examined, no change in status, stable for surgery.  I have reviewed the patient's chart and labs.  Questions were answered to the patient's satisfaction.     Autumn Messing III

## 2020-09-19 NOTE — Discharge Instructions (Addendum)
Next dose of Tylenol can be given at 6:30pm if needed. Next dose of NSAID (Motrin/Aleve/Ibuprofen) can be given at 8:30pm if needed.  Post Anesthesia Home Care Instructions  Activity: Get plenty of rest for the remainder of the day. A responsible individual must stay with you for 24 hours following the procedure.  For the next 24 hours, DO NOT: -Drive a car -Paediatric nurse -Drink alcoholic beverages -Take any medication unless instructed by your physician -Make any legal decisions or sign important papers.  Meals: Start with liquid foods such as gelatin or soup. Progress to regular foods as tolerated. Avoid greasy, spicy, heavy foods. If nausea and/or vomiting occur, drink only clear liquids until the nausea and/or vomiting subsides. Call your physician if vomiting continues.  Special Instructions/Symptoms: Your throat may feel dry or sore from the anesthesia or the breathing tube placed in your throat during surgery. If this causes discomfort, gargle with warm salt water. The discomfort should disappear within 24 hours.  If you had a scopolamine patch placed behind your ear for the management of post- operative nausea and/or vomiting:  1. The medication in the patch is effective for 72 hours, after which it should be removed.  Wrap patch in a tissue and discard in the trash. Wash hands thoroughly with soap and water. 2. You may remove the patch earlier than 72 hours if you experience unpleasant side effects which may include dry mouth, dizziness or visual disturbances. 3. Avoid touching the patch. Wash your hands with soap and water after contact with the patch.

## 2020-09-19 NOTE — H&P (Signed)
PROVIDER:  Landry Corporal, MD   MRN: [id number] DOB: [birth year] Subjective    Chief Complaint: Breast Cancer and Re-check breast       History of Present Illness: Marisa Golden is a 65 y.o. female who is seen today for left breast ductal carcinoma in situ.  The patient is a 65 year old white female who was initially found to have a complex sclerosing lesion in the upper portion of the left breast.  When she went for seed localization she was found to have a second area of distortion medially that had not been previously evaluated.  This was biopsied and came back as ductal carcinoma in situ that was ER and PR negative.       Review of Systems: A complete review of systems was obtained from the patient.  I have reviewed this information and discussed as appropriate with the patient.  See HPI as well for other ROS.   Review of Systems  Constitutional: Positive for weight loss.  HENT: Negative.   Eyes: Negative.   Respiratory: Negative.   Cardiovascular: Negative.   Gastrointestinal: Negative.   Genitourinary: Negative.   Musculoskeletal: Negative.   Skin: Negative.   Neurological: Negative.   Endo/Heme/Allergies: Negative.   Psychiatric/Behavioral: Negative.         Medical History: Past Medical History  No past medical history on file.     There is no problem list on file for this patient.     Past Surgical History  No past surgical history on file.      Allergies      Allergies  Allergen Reactions   Opioids - Morphine Analogues Hives and Itching              Current Outpatient Medications on File Prior to Visit  Medication Sig Dispense Refill   cholecalciferol (CHOLECALCIFEROL) 1000 unit tablet Take by mouth       dextromethorphan-guaifenesin (MUCINEX DM) 30-600 mg ER tablet Take 1 tablet by mouth every 12 (twelve) hours       ibuprofen (MOTRIN) 100 mg/5 mL suspension Take by mouth every 6 (six) hours as needed for Fever       multivitamin with minerals  tablet Take 1 tablet by mouth once daily       potassium chloride 20 mEq/15 mL oral solution Take by mouth once daily       triamcinolone (NASACORT AQ) 55 mcg nasal spray Place 2 sprays into both nostrils once daily       black cohosh 20 mg Tab Take 1 capsule by mouth once daily       cholecalciferol (CHOLECALCIFEROL) 1000 unit tablet Take by mouth once daily        No current facility-administered medications on file prior to visit.      Family History  No family history on file.      Social History       Tobacco Use  Smoking Status Not on file  Smokeless Tobacco Not on file      Social History  Social History        Socioeconomic History   Marital status: Married        Objective:      There were no vitals filed for this visit.  There is no height or weight on file to calculate BMI.   Physical Exam Vitals reviewed.  Constitutional:      General: She is not in acute distress.    Appearance: Normal appearance.  HENT:  Head: Normocephalic and atraumatic.     Right Ear: External ear normal.     Left Ear: External ear normal.     Nose: Nose normal.     Mouth/Throat:     Mouth: Mucous membranes are moist.     Pharynx: Oropharynx is clear.  Eyes:     General: No scleral icterus.    Extraocular Movements: Extraocular movements intact.     Conjunctiva/sclera: Conjunctivae normal.     Pupils: Pupils are equal, round, and reactive to light.  Cardiovascular:     Rate and Rhythm: Normal rate and regular rhythm.     Pulses: Normal pulses.     Heart sounds: Normal heart sounds.  Pulmonary:     Effort: Pulmonary effort is normal. No respiratory distress.     Breath sounds: Normal breath sounds.  Abdominal:     General: Bowel sounds are normal.     Palpations: Abdomen is soft.     Tenderness: There is no abdominal tenderness.  Musculoskeletal:        General: No swelling, tenderness or deformity. Normal range of motion.     Cervical back: Normal range of motion  and neck supple.  Skin:    General: Skin is warm and dry.     Coloration: Skin is not jaundiced.  Neurological:     General: No focal deficit present.     Mental Status: She is alert and oriented to person, place, and time.  Psychiatric:        Mood and Affect: Mood normal.        Behavior: Behavior normal.     Breast: There is no palpable mass in either breast.  There is no palpable axillary, supraclavicular, or cervical lymphadenopathy.         Labs, Imaging and Diagnostic Testing:         Assessment and Plan:  Diagnoses and all orders for this visit:   Ductal carcinoma in situ (DCIS) of left breast -     CCS Case Posting Request; Future       The patient appears to have a small area of ductal carcinoma in situ in the medial left breast with a small area of complex sclerosing lesion also in the upper portion of the left breast.  I have discussed with her in detail the options for treatment and at this point she favors breast conservation which I feel is very reasonable to treat her cancer.  I have discussed with her in detail the risks and benefits of the operation as well as some of the technical aspects including the use of a radioactive seed for localization and she understands and wishes to proceed.  She will need to localized lumpectomies to remove both areas.  I will go ahead and refer her to medical and radiation oncology to discuss adjuvant therapy.

## 2020-09-19 NOTE — Transfer of Care (Signed)
Immediate Anesthesia Transfer of Care Note  Patient: Marisa Golden  Procedure(s) Performed: LEFT BREAST LUMPECTOMY WITH RADIOACTIVE SEED LOCALIZATION X 2 (Left: Breast)  Patient Location: PACU  Anesthesia Type:General  Level of Consciousness: sedated  Airway & Oxygen Therapy: Patient Spontanous Breathing and Patient connected to face mask oxygen  Post-op Assessment: Report given to RN and Post -op Vital signs reviewed and stable  Post vital signs: Reviewed and stable  Last Vitals:  Vitals Value Taken Time  BP    Temp    Pulse    Resp    SpO2      Last Pain:  Vitals:   09/19/20 1214  TempSrc: Oral  PainSc: 0-No pain      Patients Stated Pain Goal: 7 ([id number])  Complications: No notable events documented.

## 2020-09-19 NOTE — Op Note (Signed)
09/19/2020  2:02 PM  PATIENT:  Marisa Golden  65 y.o. female  PRE-OPERATIVE DIAGNOSIS:  LEFT BREAST DCIS, CSL  POST-OPERATIVE DIAGNOSIS:  LEFT BREAST DCIS, CSL  PROCEDURE:  Procedure(s): LEFT BREAST LUMPECTOMY WITH RADIOACTIVE SEED LOCALIZATION X 2 (Left)  SURGEON:  Surgeon(s) and Role:    * Jovita Kussmaul, MD - Primary  PHYSICIAN ASSISTANT:   ASSISTANTS: none   ANESTHESIA:   local and general  EBL:  5 mL   BLOOD ADMINISTERED:none  DRAINS: none   LOCAL MEDICATIONS USED:  MARCAINE     SPECIMEN:  Source of Specimen:  left breast tissue superior, left breast tissue medial with additional medial and lateral margins  DISPOSITION OF SPECIMEN:  PATHOLOGY  COUNTS:  YES  TOURNIQUET:  * No tourniquets in log *  DICTATION: .Dragon Dictation  After informed consent was obtained the patient was brought to the operating room and placed in the supine position on the operating table.  After adequate induction of general anesthesia the patient's left breast was prepped with ChloraPrep, allowed to dry, and draped in usual sterile manner.  An appropriate timeout was performed.  Previously an I-125 seed was placed in the upper portion of the left breast to mark an area of complex sclerosing lesion.  A second I-125 seed was placed in the medial aspect of the left breast to mark an area of ductal carcinoma in situ.  The neoprobe was set to I-125 and the 2 areas were readily identified.  The area around this was infiltrated with quarter percent Marcaine.  These areas were close enough that I felt I could get both of them through the same incision.  A curvilinear incision was made along the inner and upper aspect of the areola of the left breast with a 15 blade knife.  The incision was carried through the skin and subcutaneous tissue sharply with the electrocautery.  Dissection was then carried throughout the upper and inner area of the left breast between the breast tissue and the subcutaneous fat  and skin.  Once the dissection was well beyond the 2 radioactive seeds I then turned my attention first to the upper seed.  A circular portion of breast tissue was excised sharply with the electrocautery around the radioactive seed while checking the area of radioactivity frequently.  This was the benign area.  Once the specimen was removed it was oriented with the appropriate paint colors.  A specimen radiograph was obtained that showed the clip and seed to be within the specimen.  The specimen was then sent to pathology for further evaluation.  Attention was then turned to the more medial seed.  Again a circular portion of breast tissue was excised sharply around the radioactive seed while checking the area of radioactivity frequently.  This dissection was carried all the way to the chest wall.  Once the specimen was removed it was oriented with the appropriate paint colors.  A specimen radiograph was obtained that showed the clip and seed to be within the specimen.  I did elect to take an additional medial and lateral margin and these were marked appropriately.  The superior edge of the DCIS specimen did correspond to the medial and inferior edge of the CSL specimen.  All of the tissue was then sent to pathology for further evaluation.  Hemostasis was achieved using the Bovie electrocautery.  The cavity was marked with clips.  The cavity was irrigated with saline and infiltrated with more quarter percent Marcaine.  The  deep layer of the wound was then closed with layers of interrupted 3-0 Vicryl stitches.  The skin was closed with interrupted 4-0 Monocryl subcuticular stitches.  Dermabond dressings were applied.  The patient tolerated the procedure well.  At the end of the case all needle sponge and instrument counts were correct.  The patient was then awakened and taken to recovery in stable condition.  PLAN OF CARE: Discharge to home after PACU  PATIENT DISPOSITION:  PACU - hemodynamically stable.   Delay  start of Pharmacological VTE agent (>24hrs) due to surgical blood loss or risk of bleeding: not applicable

## 2020-09-19 NOTE — Anesthesia Preprocedure Evaluation (Addendum)
Anesthesia Evaluation  Patient identified by MRN, date of birth, ID band Patient awake    Reviewed: Allergy & Precautions, NPO status , Patient's Chart, lab work & pertinent test results  History of Anesthesia Complications (+) PONV and history of anesthetic complications  Airway Mallampati: I  TM Distance: >3 FB Neck ROM: Full    Dental  (+) Dental Advisory Given   Pulmonary COPD, Current Smoker,    Pulmonary exam normal breath sounds clear to auscultation       Cardiovascular negative cardio ROS Normal cardiovascular exam Rhythm:Regular Rate:Normal     Neuro/Psych Seizures -,     GI/Hepatic negative GI ROS, Neg liver ROS,   Endo/Other  negative endocrine ROS  Renal/GU negative Renal ROS     Musculoskeletal  (+) Arthritis ,   Abdominal   Peds  Hematology negative hematology ROS (+)   Anesthesia Other Findings   Reproductive/Obstetrics                            Anesthesia Physical Anesthesia Plan  ASA: 2  Anesthesia Plan: General   Post-op Pain Management:    Induction: Intravenous  PONV Risk Score and Plan: 4 or greater and Ondansetron, Dexamethasone, Treatment may vary due to age or medical condition, Midazolam, Scopolamine patch - Pre-op and Diphenhydramine  Airway Management Planned: LMA  Additional Equipment: None  Intra-op Plan:   Post-operative Plan: Extubation in OR  Informed Consent: I have reviewed the patients History and Physical, chart, labs and discussed the procedure including the risks, benefits and alternatives for the proposed anesthesia with the patient or authorized representative who has indicated his/her understanding and acceptance.     Dental advisory given  Plan Discussed with: CRNA  Anesthesia Plan Comments:         Anesthesia Quick Evaluation

## 2020-09-22 ENCOUNTER — Encounter (HOSPITAL_BASED_OUTPATIENT_CLINIC_OR_DEPARTMENT_OTHER): Payer: Self-pay | Admitting: General Surgery

## 2020-09-23 ENCOUNTER — Encounter: Payer: Self-pay | Admitting: *Deleted

## 2020-09-23 LAB — SURGICAL PATHOLOGY

## 2020-09-23 NOTE — Anesthesia Postprocedure Evaluation (Signed)
Anesthesia Post Note  Patient: Marisa Golden  Procedure(s) Performed: LEFT BREAST LUMPECTOMY WITH RADIOACTIVE SEED LOCALIZATION X 2 (Left: Breast)     Patient location during evaluation: PACU Anesthesia Type: General Level of consciousness: sedated and patient cooperative Pain management: pain level controlled Vital Signs Assessment: post-procedure vital signs reviewed and stable Respiratory status: spontaneous breathing Cardiovascular status: stable Anesthetic complications: no   No notable events documented.  Last Vitals:  Vitals:   09/19/20 1530 09/19/20 1550  BP: 115/67 124/77  Pulse: 75 79  Resp: 19 18  Temp:  36.6 C  SpO2: 93% 93%    Last Pain:  Vitals:   09/22/20 0912  TempSrc:   PainSc: 0-No pain                 Nolon Nations

## 2020-09-25 NOTE — Progress Notes (Signed)
  HEMATOLOGY-ONCOLOGY MYCHART VIDEO VISIT PROGRESS NOTE  I connected with [name] on 09/26/2020 at 10:30 AM EDT by MyChart video conference and verified that I am speaking with the correct person using two identifiers.  I discussed the limitations, risks, security and privacy concerns of performing an evaluation and management service by MyChart and the availability of in person appointments.  I also discussed with the patient that there may be a patient responsible charge related to this service. The patient expressed understanding and agreed to proceed.  Patient's Location: Home Physician Location: Clinic  CHIEF COMPLIANT: Follow-up s/p lumpectomy  INTERVAL HISTORY: Marisa Golden is a 65 y.o. female with above-mentioned history of DCIS of the left breast. She underwent an left lumpectomy on 09/19/20 with Dr. Jovita Kussmaul for which the pathology showed high-grade DCIS with margins negative for carcinoma. She presents via MyChart today for follow-up.   Oncology History Overview Note  Ductal carcinoma in situ of the left breast  Diagnostic mammogram and [place] on 06/18/20 showed persistent subtle architectural distortion involving the UIQ of the left breast and no pathologic left axillary lymphadenopathy. Biopsy on 08/14/20 showed high grade DCIS with necrosis ER/PR-.    Ductal carcinoma in situ (DCIS) of left breast  08/27/2020 Initial Diagnosis   Ductal carcinoma in situ (DCIS) of left breast   08/27/2020 Cancer Staging   Staging form: Breast, AJCC 8th Edition - Clinical stage from 08/27/2020: Stage 0 (cTis (DCIS), cN0, cM0, G3, ER-, PR-, HER2: Not Assessed) - Signed by Nicholas Lose, MD on 08/27/2020 Stage prefix: Initial diagnosis Histologic grading system: 3 grade system   09/19/2020 Surgery   Left lumpectomy (Dr. Jovita Kussmaul): high-grade DCIS with margins negative for carcinoma     Observations/Objective:  There were no vitals filed for this visit. There is no height or weight  on file to calculate BMI.  I have reviewed the data as listed No flowsheet data found.  Lab Results  Component Value Date   WBC 8.3 12/31/2013   HGB 14.3 12/31/2013   HCT 44.2 12/31/2013   MCV 87.5 12/31/2013   PLT 194.0 12/31/2013   NEUTROABS 4.8 12/31/2013      Assessment Plan:  Ductal carcinoma in situ (DCIS) of left breast 09/19/2020:Left lumpectomy (Dr. Jovita Kussmaul): high-grade DCIS with margins negative for carcinoma  Pathology counseling: I discussed the final pathology report of the patient provided  a copy of this report. I discussed the margins.  We also discussed the final staging along with previously performed ER/PR testing.  Treatment plan: Adjuvant radiation followed by surveillance. Return to clinic to see me on an as-needed basis.    I discussed the assessment and treatment plan with the patient. The patient was provided an opportunity to ask questions and all were answered. The patient agreed with the plan and demonstrated an understanding of the instructions. The patient was advised to call back or seek an in-person evaluation if the symptoms worsen or if the condition fails to improve as anticipated.   Total time spent: 20 minutes including face-to-face MyChart video visit time and time spent for planning, charting and coordination of care  Rulon Eisenmenger, MD 09/26/2020  I, Thana Ates am acting as scribe for Nicholas Lose, MD.  I have reviewed the above documentation for accuracy and completeness, and I agree with the above.

## 2020-09-26 ENCOUNTER — Inpatient Hospital Stay: Payer: BC Managed Care – PPO | Attending: Hematology and Oncology | Admitting: Hematology and Oncology

## 2020-09-26 DIAGNOSIS — D0512 Intraductal carcinoma in situ of left breast: Secondary | ICD-10-CM

## 2020-09-26 NOTE — Assessment & Plan Note (Signed)
09/19/2020:Left lumpectomy (Dr. Jovita Kussmaul): high-grade DCIS with margins negative for carcinoma  Pathology counseling: I discussed the final pathology report of the patient provided  a copy of this report. I discussed the margins.  We also discussed the final staging along with previously performed ER/PR testing.  Treatment plan: Adjuvant radiation followed by surveillance. Return to clinic to see me on an as-needed basis.

## 2020-10-13 ENCOUNTER — Encounter: Payer: Self-pay | Admitting: Radiation Oncology

## 2020-10-13 NOTE — Progress Notes (Addendum)
Patient denies pain, fatigue, skin changes, swelling, tenderness, and has good range of motion.  Meaningful use questions complete.  Patient notified of current 9:30am telephone visit on 10/15/20 (recently switched from in person to telephone), due to Covid positive results and expressed understanding.  Patient aware of pending decision for CT-Sim appointment due to her Covid positive results.

## 2020-10-15 ENCOUNTER — Other Ambulatory Visit: Payer: Self-pay

## 2020-10-15 ENCOUNTER — Ambulatory Visit: Payer: BC Managed Care – PPO | Admitting: Radiation Oncology

## 2020-10-15 ENCOUNTER — Ambulatory Visit
Admission: RE | Admit: 2020-10-15 | Discharge: 2020-10-15 | Disposition: A | Discharge status: Home / Self Care | Payer: BC Managed Care – PPO | Source: Ambulatory Visit | Attending: Radiation Oncology | Admitting: Radiation Oncology

## 2020-10-15 DIAGNOSIS — D0512 Intraductal carcinoma in situ of left breast: Secondary | ICD-10-CM

## 2020-10-15 NOTE — Progress Notes (Signed)
Radiation Oncology         [phone] ________________________________  Outpatient Follow Up- Conducted via telephone due to current COVID-19 concerns for limiting patient exposure  I spoke with the patient to conduct this consult visit via telephone to spare the patient unnecessary potential exposure in the healthcare setting during the current COVID-19 pandemic. The patient was notified in advance and was offered a [place] meeting to allow for face to face communication but unfortunately reported that they did not have the appropriate resources/technology to support such a visit and instead preferred to proceed with a telephone visit.  Name: Marisa Golden        MRN: [id number]  Date of Service: 10/15/2020 DOB: [birth year]  CC:Pcp, No  Nicholas Lose, MD     REFERRING PHYSICIAN: Nicholas Lose, MD   DIAGNOSIS: The encounter diagnosis was Ductal carcinoma in situ (DCIS) of left breast.   HISTORY OF PRESENT ILLNESS: Marisa Golden is a 65 y.o. female with a diagnosis of left breast cancer. The patient was being followed with diagnostic mammography given a screening recall finding in the left breast.  In 2019 no findings were noted of concern with spot compression. Her diagnostic mammogram on 06/18/2020 showed subtle persistent architectural change in the upper inner quadrant of the left breast without any ultrasound correlate so she underwent a stereotactic biopsy on 06/20/2020 which showed a complex sclerosing lesion with calcifications within the upper inner quadrant specimen.  She did have a newly noted finding adjacent to this and a stereotactic biopsy on 08/14/2020 showed high-grade DCIS with necrosis, her tumor was ER/PR negative.    Since her last visit she underwent left lumpectomy on 09/19/20 and final pathology revealed focal residual complex sclerosing lesion with calcifications and the medial specimen showed high grade DCIS with necrosis and clear margins. She unfortunately developed COVID  and tested positive on 8/23 and 8/28 was still positive, her last day of fever was 10/10/20. Her simulation for today will be moved out accordingly but she's contacted by phone to discuss treatment of her cancer.    PREVIOUS RADIATION THERAPY: No   PAST MEDICAL HISTORY:  Past Medical History:  Diagnosis Date   Allergy    Arthritis    KNEES,NECK   Blood transfusion without reported diagnosis    Breast cancer ([place]) 18/84/1660   Complication of anesthesia    COPD (chronic obstructive pulmonary disease) (HCC)    Multiple allergies    radweed, pollen, grasses   Pneumothorax    x 4- first happened in 1980   PONV (postoperative nausea and vomiting)    Seizures ([place])    age 45 had 41 sz with a fall but never again        PAST SURGICAL HISTORY: Past Surgical History:  Procedure Laterality Date   BREAST LUMPECTOMY WITH RADIOACTIVE SEED LOCALIZATION Left 09/19/2020   Procedure: LEFT BREAST LUMPECTOMY WITH RADIOACTIVE SEED LOCALIZATION X 2;  Surgeon: Jovita Kussmaul, MD;  Location: [facility];  Service: General;  Laterality: Left;   CARPAL TUNNEL RELEASE     left wrist   COLONOSCOPY     POLYPECTOMY     THORACOTOMY  1983   TUBAL LIGATION       FAMILY HISTORY:  Family History  Problem Relation Age of Onset   Colon polyps Father    Breast cancer Maternal Aunt    Colon cancer Neg Hx    Rectal cancer Neg Hx    Stomach cancer Neg Hx  SOCIAL HISTORY:  reports that she has been smoking cigarettes. She has a 40.00 pack-year smoking history. She has never used smokeless tobacco. She reports current drug use. Drug: Marijuana. She reports that she does not drink alcohol. The patient is married and lives in [place]. She works for an [employer] and visits her mother most days in a facility.   ALLERGIES: Morphine and related   MEDICATIONS:  Current Outpatient Medications  Medication Sig Dispense Refill   cholecalciferol (VITAMIN D) 1000 UNITS tablet Take  1,000 Units by mouth daily.     Dextromethorphan-Guaifenesin (MUCINEX DM PO) Take 1 tablet by mouth 2 (two) times daily as needed.      docusate sodium (COLACE) 100 MG capsule Take 100 mg by mouth daily.       levocetirizine (XYZAL) 2.5 MG/5ML solution Take 2.5 mg by mouth every evening.     Multiple Vitamins-Minerals (MULTIVITAMIN WITH MINERALS) tablet Take 1 tablet by mouth daily.       POTASSIUM CHLORIDE PO Take 10 mEq by mouth.     traMADol (ULTRAM) 50 MG tablet Take 1 tablet (50 mg total) by mouth every 6 (six) hours as needed. 20 tablet 0   Black Cohosh 450 MG CAPS Take 1 capsule by mouth daily. (Patient not taking: Reported on 10/13/2020)     Current Facility-Administered Medications  Medication Dose Route Frequency Provider Last Rate Last Admin   0.9 %  sodium chloride infusion  500 mL Intravenous Continuous Lafayette Dragon, MD         REVIEW OF SYSTEMS: On review of systems, the patient reports that she is doing well overall. She feels still quite tired from her recent illness. From a surgical perspective though she is feeling well and does not have concerns with pain, bruising or the appearance of her surgical site. She's now back to work. No other complaints are verbalized.     PHYSICAL EXAM:  Unable to assess due to encounter type.    ECOG = 1  0 - Asymptomatic (Fully active, able to carry on all predisease activities without restriction)  1 - Symptomatic but completely ambulatory (Restricted in physically strenuous activity but ambulatory and able to carry out work of a light or sedentary nature. For example, light housework, office work)  2 - Symptomatic, <50% in bed during the day (Ambulatory and capable of all self care but unable to carry out any work activities. Up and about more than 50% of waking hours)  3 - Symptomatic, >50% in bed, but not bedbound (Capable of only limited self-care, confined to bed or chair 50% or more of waking hours)  4 - Bedbound (Completely  disabled. Cannot carry on any self-care. Totally confined to bed or chair)  5 - Death   Eustace Pen MM, Creech RH, Tormey DC, et al. [phone]). "Toxicity and response criteria of the [facility]". [place] Oncol. 5 (6): 649-55    LABORATORY DATA:  Lab Results  Component Value Date   WBC 8.3 12/31/2013   HGB 14.3 12/31/2013   HCT 44.2 12/31/2013   MCV 87.5 12/31/2013   PLT 194.0 12/31/2013   No results found for: NA, K, CL, CO2 No results found for: ALT, AST, GGT, ALKPHOS, BILITOT    RADIOGRAPHY: MM Breast Surgical Specimen  Result Date: 09/19/2020 CLINICAL DATA:  Status post 2 surgical excisions today after earlier radioactive seed localizations. EXAM: SPECIMEN RADIOGRAPH OF THE LEFT BREAST COMPARISON:  Previous exam(s). FINDINGS: Status post excision of the  left breast. The radioactive seed and coil shaped biopsy marker clip present and completely intact. In a separate specimen, the radioactive seed and ribbon shaped clip are present and completely intact. Findings discussed with the OR staff during the procedure. IMPRESSION: Specimen radiograph of the left breast. Electronically Signed   By: Franki Cabot M.D.   On: 09/19/2020 13:59  MM BREAST SURGICAL SPECIMEN  Result Date: 09/19/2020 CLINICAL DATA:  Status post 2 surgical excisions today after earlier radioactive seed localizations. EXAM: SPECIMEN RADIOGRAPH OF THE LEFT BREAST COMPARISON:  Previous exam(s). FINDINGS: Status post excision of the left breast. The radioactive seed and ribbon shaped biopsy marker clip present and completely intact. In a separate specimen, the radioactive seed and coil shaped clip are present and completely intact. Findings discussed with the OR staff during the procedure. IMPRESSION: Specimen radiograph of the left breast. Electronically Signed   By: Franki Cabot M.D.   On: 09/19/2020 13:59  MM LT RADIOACTIVE SEED LOC MAMMO GUIDE  Result Date: 09/18/2020 CLINICAL DATA:  Localization 2  biopsy clips prior to surgery. EXAM: MAMMOGRAPHIC GUIDED RADIOACTIVE SEED LOCALIZATION OF THE LEFT BREAST COMPARISON:  Previous exam(s). FINDINGS: Patient presents for radioactive seed localization prior to surgery. I met with the patient and we discussed the procedure of seed localization including benefits and alternatives. We discussed the high likelihood of a successful procedure. We discussed the risks of the procedure including infection, bleeding, tissue injury and further surgery. We discussed the low dose of radioactivity involved in the procedure. Informed, written consent was given. The usual time-out protocol was performed immediately prior to the procedure. Using mammographic guidance, sterile technique, 1% lidocaine and an I-125 radioactive seed, the coil shaped clip was localized using a superior approach. The follow-up mammogram images confirm the seed in the expected location and were marked for the surgeon. Follow-up survey of the patient confirms presence of the radioactive seed. Order number of I-125 seed:  2022 [id number]. Total activity:  6.144 millicuries reference Date: September 03, 2020 The patient tolerated the procedure well and was released from the [place]. She was given instructions regarding seed removal. Using mammographic guidance, sterile technique, 1% lidocaine and an I-125 radioactive seed, the site of known DCIS, 7 mm medial to the ribbon shaped clip was localized using a medial approach. The follow-up mammogram images confirm the seed in the expected location and were marked for the surgeon. Follow-up survey of the patient confirms presence of the radioactive seed. Order number of I-125 seed:  [id number]. Total activity:  6.195 millicuries reference Date: September 03, 2020 The patient tolerated the procedure well and was released from the [place]. She was given instructions regarding seed removal. IMPRESSION: Radioactive seed localization left breast. No apparent complications.  Electronically Signed   By: Dorise Bullion III M.D   On: 09/18/2020 14:34  MM LT RAD SEED EA ADD LESION LOC MAMMO  Result Date: 09/18/2020 CLINICAL DATA:  Localization 2 biopsy clips prior to surgery. EXAM: MAMMOGRAPHIC GUIDED RADIOACTIVE SEED LOCALIZATION OF THE LEFT BREAST COMPARISON:  Previous exam(s). FINDINGS: Patient presents for radioactive seed localization prior to surgery. I met with the patient and we discussed the procedure of seed localization including benefits and alternatives. We discussed the high likelihood of a successful procedure. We discussed the risks of the procedure including infection, bleeding, tissue injury and further surgery. We discussed the low dose of radioactivity involved in the procedure. Informed, written consent was given. The usual time-out protocol was performed immediately prior to  the procedure. Using mammographic guidance, sterile technique, 1% lidocaine and an I-125 radioactive seed, the coil shaped clip was localized using a superior approach. The follow-up mammogram images confirm the seed in the expected location and were marked for the surgeon. Follow-up survey of the patient confirms presence of the radioactive seed. Order number of I-125 seed:  2022 [id number]. Total activity:  0.786 millicuries reference Date: September 03, 2020 The patient tolerated the procedure well and was released from the [place]. She was given instructions regarding seed removal. Using mammographic guidance, sterile technique, 1% lidocaine and an I-125 radioactive seed, the site of known DCIS, 7 mm medial to the ribbon shaped clip was localized using a medial approach. The follow-up mammogram images confirm the seed in the expected location and were marked for the surgeon. Follow-up survey of the patient confirms presence of the radioactive seed. Order number of I-125 seed:  [id number]. Total activity:  0.712 millicuries reference Date: September 03, 2020 The patient tolerated the procedure well  and was released from the [facility]. She was given instructions regarding seed removal. IMPRESSION: Radioactive seed localization left breast. No apparent complications. Electronically Signed   By: Dorise Bullion III M.D   On: 09/18/2020 14:34      IMPRESSION/PLAN: 1. High grade ER/PR negative DCIS of the left breast. Dr. Lisbeth Renshaw discusses the reviews her final pathology and the nature of early stage breast disease. Dr. Lisbeth Renshaw reviews the rationale of radiotherapy to the breast  to reduce risks of local recurrence. We discussed the risks, benefits, short, and long term effects of radiotherapy, as well as the curative intent, and the patient is interested in proceeding. Dr. Lisbeth Renshaw discusses the delivery and logistics of radiotherapy and recommends 4 weeks of radiotherapy to the left breast with deep inspiration breath hold technique. She will simulate next week any day Tuesday or later so she will be outside of 10 days following her last febrile day from [place]. She will sign written consent that day to proceed.   Given current concerns for patient exposure during the COVID-19 pandemic, this encounter was conducted via telephone.  The patient has provided two factor identification and has given verbal consent for this type of encounter and has been advised to only accept a meeting of this type in a secure network environment. The time spent during this encounter was 45 minutes including preparation, discussion, and coordination of the patient's care. The attendants for this meeting include  Dr. Lisbeth Renshaw, Hayden Pedro  and [name].  During the encounter,  Dr. Lisbeth Renshaw, and Hayden Pedro were located at [facility] Radiation Oncology Department.  [name] was located at work.    The above documentation reflects my direct findings during this shared patient visit. Please see the separate note by Dr. Lisbeth Renshaw on this date for the remainder of the patient's plan of  care.    Carola Rhine, [facility]    **Disclaimer: This note was dictated with voice recognition software. Similar sounding words can inadvertently be transcribed and this note may contain transcription errors which may not have been corrected upon publication of note.**

## 2020-10-15 NOTE — Addendum Note (Signed)
Encounter addended by: Hayden Pedro, PA-C on: 10/15/2020 9:52 AM  Actions taken: Level of Service modified

## 2020-10-23 ENCOUNTER — Encounter: Payer: Self-pay | Admitting: *Deleted

## 2020-10-24 ENCOUNTER — Ambulatory Visit
Admission: RE | Admit: 2020-10-24 | Discharge: 2020-10-24 | Disposition: A | Discharge status: Home / Self Care | Payer: BC Managed Care – PPO | Source: Ambulatory Visit | Attending: Radiation Oncology | Admitting: Radiation Oncology

## 2020-10-24 ENCOUNTER — Other Ambulatory Visit: Payer: Self-pay

## 2020-10-24 DIAGNOSIS — D0512 Intraductal carcinoma in situ of left breast: Secondary | ICD-10-CM | POA: Insufficient documentation

## 2020-10-24 DIAGNOSIS — Z510 Encounter for antineoplastic radiation therapy: Secondary | ICD-10-CM | POA: Diagnosis not present

## 2020-10-30 DIAGNOSIS — Z510 Encounter for antineoplastic radiation therapy: Secondary | ICD-10-CM | POA: Diagnosis not present

## 2020-11-03 ENCOUNTER — Other Ambulatory Visit: Payer: Self-pay

## 2020-11-03 ENCOUNTER — Ambulatory Visit
Admission: RE | Admit: 2020-11-03 | Discharge: 2020-11-03 | Disposition: A | Discharge status: Home / Self Care | Payer: BC Managed Care – PPO | Source: Ambulatory Visit | Attending: Radiation Oncology | Admitting: Radiation Oncology

## 2020-11-03 DIAGNOSIS — Z510 Encounter for antineoplastic radiation therapy: Secondary | ICD-10-CM | POA: Diagnosis not present

## 2020-11-04 ENCOUNTER — Ambulatory Visit
Admission: RE | Admit: 2020-11-04 | Discharge: 2020-11-04 | Disposition: A | Discharge status: Home / Self Care | Payer: BC Managed Care – PPO | Source: Ambulatory Visit | Attending: Radiation Oncology | Admitting: Radiation Oncology

## 2020-11-04 DIAGNOSIS — Z510 Encounter for antineoplastic radiation therapy: Secondary | ICD-10-CM | POA: Diagnosis not present

## 2020-11-05 ENCOUNTER — Ambulatory Visit
Admission: RE | Admit: 2020-11-05 | Discharge: 2020-11-05 | Disposition: A | Discharge status: Home / Self Care | Payer: BC Managed Care – PPO | Source: Ambulatory Visit | Attending: Radiation Oncology | Admitting: Radiation Oncology

## 2020-11-05 ENCOUNTER — Other Ambulatory Visit: Payer: Self-pay

## 2020-11-05 DIAGNOSIS — Z510 Encounter for antineoplastic radiation therapy: Secondary | ICD-10-CM | POA: Diagnosis not present

## 2020-11-06 ENCOUNTER — Ambulatory Visit
Admission: RE | Admit: 2020-11-06 | Discharge: 2020-11-06 | Disposition: A | Discharge status: Home / Self Care | Payer: BC Managed Care – PPO | Source: Ambulatory Visit | Attending: Radiation Oncology | Admitting: Radiation Oncology

## 2020-11-06 DIAGNOSIS — Z510 Encounter for antineoplastic radiation therapy: Secondary | ICD-10-CM | POA: Diagnosis not present

## 2020-11-06 NOTE — Progress Notes (Signed)

## 2020-11-07 ENCOUNTER — Ambulatory Visit
Admission: RE | Admit: 2020-11-07 | Discharge: 2020-11-07 | Disposition: A | Discharge status: Home / Self Care | Payer: BC Managed Care – PPO | Source: Ambulatory Visit | Attending: Radiation Oncology | Admitting: Radiation Oncology

## 2020-11-07 ENCOUNTER — Other Ambulatory Visit: Payer: Self-pay

## 2020-11-07 DIAGNOSIS — Z510 Encounter for antineoplastic radiation therapy: Secondary | ICD-10-CM | POA: Diagnosis not present

## 2020-11-07 DIAGNOSIS — D0512 Intraductal carcinoma in situ of left breast: Secondary | ICD-10-CM

## 2020-11-07 MED ORDER — RADIAPLEXRX EX GEL: Freq: Once | CUTANEOUS | Status: AC

## 2020-11-07 MED ORDER — ALRA NON-METALLIC DEODORANT (RAD-ONC)
1.0000 "application " | Freq: Once | TOPICAL | Status: AC
  Administered 2020-11-07: 1 via TOPICAL

## 2020-11-10 ENCOUNTER — Ambulatory Visit
Admission: RE | Admit: 2020-11-10 | Discharge: 2020-11-10 | Disposition: A | Discharge status: Home / Self Care | Payer: BC Managed Care – PPO | Source: Ambulatory Visit | Attending: Radiation Oncology | Admitting: Radiation Oncology

## 2020-11-10 DIAGNOSIS — Z510 Encounter for antineoplastic radiation therapy: Secondary | ICD-10-CM | POA: Diagnosis not present

## 2020-11-11 ENCOUNTER — Other Ambulatory Visit: Payer: Self-pay

## 2020-11-11 ENCOUNTER — Ambulatory Visit
Admission: RE | Admit: 2020-11-11 | Discharge: 2020-11-11 | Disposition: A | Discharge status: Home / Self Care | Payer: BC Managed Care – PPO | Source: Ambulatory Visit | Attending: Radiation Oncology | Admitting: Radiation Oncology

## 2020-11-11 DIAGNOSIS — Z510 Encounter for antineoplastic radiation therapy: Secondary | ICD-10-CM | POA: Diagnosis not present

## 2020-11-12 ENCOUNTER — Encounter: Payer: Self-pay | Admitting: Gastroenterology

## 2020-11-12 ENCOUNTER — Ambulatory Visit
Admission: RE | Admit: 2020-11-12 | Discharge: 2020-11-12 | Disposition: A | Discharge status: Home / Self Care | Payer: BC Managed Care – PPO | Source: Ambulatory Visit | Attending: Radiation Oncology | Admitting: Radiation Oncology

## 2020-11-12 DIAGNOSIS — Z510 Encounter for antineoplastic radiation therapy: Secondary | ICD-10-CM | POA: Diagnosis not present

## 2020-11-13 ENCOUNTER — Ambulatory Visit
Admission: RE | Admit: 2020-11-13 | Discharge: 2020-11-13 | Disposition: A | Discharge status: Home / Self Care | Payer: BC Managed Care – PPO | Source: Ambulatory Visit | Attending: Radiation Oncology | Admitting: Radiation Oncology

## 2020-11-13 ENCOUNTER — Other Ambulatory Visit: Payer: Self-pay

## 2020-11-13 DIAGNOSIS — Z510 Encounter for antineoplastic radiation therapy: Secondary | ICD-10-CM | POA: Diagnosis not present

## 2020-11-14 ENCOUNTER — Ambulatory Visit
Admission: RE | Admit: 2020-11-14 | Discharge: 2020-11-14 | Disposition: A | Discharge status: Home / Self Care | Payer: BC Managed Care – PPO | Source: Ambulatory Visit | Attending: Radiation Oncology | Admitting: Radiation Oncology

## 2020-11-14 DIAGNOSIS — Z510 Encounter for antineoplastic radiation therapy: Secondary | ICD-10-CM | POA: Diagnosis not present

## 2020-11-17 ENCOUNTER — Ambulatory Visit: Payer: BC Managed Care – PPO

## 2020-11-18 ENCOUNTER — Ambulatory Visit
Admission: RE | Admit: 2020-11-18 | Discharge: 2020-11-18 | Disposition: A | Discharge status: Home / Self Care | Payer: BC Managed Care – PPO | Source: Ambulatory Visit | Attending: Radiation Oncology | Admitting: Radiation Oncology

## 2020-11-18 ENCOUNTER — Other Ambulatory Visit: Payer: Self-pay

## 2020-11-18 DIAGNOSIS — Z510 Encounter for antineoplastic radiation therapy: Secondary | ICD-10-CM | POA: Insufficient documentation

## 2020-11-18 DIAGNOSIS — D0512 Intraductal carcinoma in situ of left breast: Secondary | ICD-10-CM | POA: Diagnosis present

## 2020-11-19 ENCOUNTER — Ambulatory Visit
Admission: RE | Admit: 2020-11-19 | Discharge: 2020-11-19 | Disposition: A | Discharge status: Home / Self Care | Payer: BC Managed Care – PPO | Source: Ambulatory Visit | Attending: Radiation Oncology | Admitting: Radiation Oncology

## 2020-11-19 DIAGNOSIS — Z510 Encounter for antineoplastic radiation therapy: Secondary | ICD-10-CM | POA: Diagnosis not present

## 2020-11-20 ENCOUNTER — Ambulatory Visit
Admission: RE | Admit: 2020-11-20 | Discharge: 2020-11-20 | Disposition: A | Discharge status: Home / Self Care | Payer: BC Managed Care – PPO | Source: Ambulatory Visit | Attending: Radiation Oncology | Admitting: Radiation Oncology

## 2020-11-20 ENCOUNTER — Other Ambulatory Visit: Payer: Self-pay

## 2020-11-20 DIAGNOSIS — Z510 Encounter for antineoplastic radiation therapy: Secondary | ICD-10-CM | POA: Diagnosis not present

## 2020-11-21 ENCOUNTER — Ambulatory Visit
Admission: RE | Admit: 2020-11-21 | Discharge: 2020-11-21 | Disposition: A | Discharge status: Home / Self Care | Payer: BC Managed Care – PPO | Source: Ambulatory Visit | Attending: Radiation Oncology | Admitting: Radiation Oncology

## 2020-11-21 DIAGNOSIS — Z510 Encounter for antineoplastic radiation therapy: Secondary | ICD-10-CM | POA: Diagnosis not present

## 2020-11-24 ENCOUNTER — Other Ambulatory Visit: Payer: Self-pay

## 2020-11-24 ENCOUNTER — Ambulatory Visit
Admission: RE | Admit: 2020-11-24 | Discharge: 2020-11-24 | Disposition: A | Discharge status: Home / Self Care | Payer: BC Managed Care – PPO | Source: Ambulatory Visit | Attending: Radiation Oncology | Admitting: Radiation Oncology

## 2020-11-24 DIAGNOSIS — Z510 Encounter for antineoplastic radiation therapy: Secondary | ICD-10-CM | POA: Diagnosis not present

## 2020-11-24 NOTE — Progress Notes (Signed)
Patient Care Team: Pcp, No as PCP - General Mauro Kaufmann, RN as Oncology Nurse Navigator Rockwell Germany, RN as Oncology Nurse Navigator  DIAGNOSIS:    ICD-10-CM   1. Ductal carcinoma in situ (DCIS) of left breast  D05.12       SUMMARY OF ONCOLOGIC HISTORY: Oncology History Overview Note  Ductal carcinoma in situ of the left breast  Diagnostic mammogram and [place] on 06/18/20 showed persistent subtle architectural distortion involving the UIQ of the left breast and no pathologic left axillary lymphadenopathy. Biopsy on 08/14/20 showed high grade DCIS with necrosis ER/PR-.    Ductal carcinoma in situ (DCIS) of left breast  08/27/2020 Initial Diagnosis   Ductal carcinoma in situ (DCIS) of left breast   08/27/2020 Cancer Staging   Staging form: Breast, AJCC 8th Edition - Clinical stage from 08/27/2020: Stage 0 (cTis (DCIS), cN0, cM0, G3, ER-, PR-, HER2: Not Assessed) - Signed by Nicholas Lose, MD on 08/27/2020 Stage prefix: Initial diagnosis Histologic grading system: 3 grade system   09/19/2020 Surgery   Left lumpectomy (Dr. Jovita Kussmaul): high-grade DCIS with margins negative for carcinoma     CHIEF COMPLIANT: Follow-up of left breast cancer  INTERVAL HISTORY: Marisa Golden is a 65 y.o. with above-mentioned history of left breast DCIS having undergone lumpectomy, currently on radiation therapy. She reports to the clinic today for follow-up.   ALLERGIES:  is allergic to morphine and related.  MEDICATIONS:  Current Outpatient Medications  Medication Sig Dispense Refill   Black Cohosh 450 MG CAPS Take 1 capsule by mouth daily. (Patient not taking: Reported on 10/13/2020)     cholecalciferol (VITAMIN D) 1000 UNITS tablet Take 1,000 Units by mouth daily.     Dextromethorphan-Guaifenesin (MUCINEX DM PO) Take 1 tablet by mouth 2 (two) times daily as needed.      docusate sodium (COLACE) 100 MG capsule Take 100 mg by mouth daily.       levocetirizine (XYZAL) 2.5 MG/5ML solution  Take 2.5 mg by mouth every evening.     Multiple Vitamins-Minerals (MULTIVITAMIN WITH MINERALS) tablet Take 1 tablet by mouth daily.       POTASSIUM CHLORIDE PO Take 10 mEq by mouth.     traMADol (ULTRAM) 50 MG tablet Take 1 tablet (50 mg total) by mouth every 6 (six) hours as needed. 20 tablet 0   Current Facility-Administered Medications  Medication Dose Route Frequency Provider Last Rate Last Admin   0.9 %  sodium chloride infusion  500 mL Intravenous Continuous Lafayette Dragon, MD        PHYSICAL EXAMINATION: ECOG PERFORMANCE STATUS: 1 - Symptomatic but completely ambulatory  There were no vitals filed for this visit. There were no vitals filed for this visit.  BREAST: No palpable masses or nodules in either right or left breasts. No palpable axillary supraclavicular or infraclavicular adenopathy no breast tenderness or nipple discharge. (exam performed in the presence of a chaperone)  LABORATORY DATA:  I have reviewed the data as listed No flowsheet data found.  Lab Results  Component Value Date   WBC 8.3 12/31/2013   HGB 14.3 12/31/2013   HCT 44.2 12/31/2013   MCV 87.5 12/31/2013   PLT 194.0 12/31/2013   NEUTROABS 4.8 12/31/2013    ASSESSMENT & PLAN:  Ductal carcinoma in situ (DCIS) of left breast 09/19/2020:Left lumpectomy (Dr. Jovita Kussmaul): high-grade DCIS with margins negative for carcinoma ER 0%, PR 0% Adjuvant radiation 11/04/2020-12/01/2020 Treatment plan: Surveillance, follow-up on an as-needed  basis. We will make a 43-monthfollow-up visit for survivorship care plan and after that she can decide if she wants to follow-up with long-term survivorship or with her surgeon for her breast examinations and follow-ups.   No orders of the defined types were placed in this encounter.  The patient has a good understanding of the overall plan. she agrees with it. she will call with any problems that may develop before the next visit here.  Total time spent: 20 mins  including face to face time and time spent for planning, charting and coordination of care  VRulon Eisenmenger MD, MPH 11/25/2020  I, KThana Ates am acting as scribe for Dr. VNicholas Lose  I have reviewed the above documentation for accuracy and completeness, and I agree with the above.

## 2020-11-25 ENCOUNTER — Ambulatory Visit
Admission: RE | Admit: 2020-11-25 | Discharge: 2020-11-25 | Disposition: A | Discharge status: Home / Self Care | Payer: BC Managed Care – PPO | Source: Ambulatory Visit | Attending: Radiation Oncology | Admitting: Radiation Oncology

## 2020-11-25 ENCOUNTER — Inpatient Hospital Stay: Payer: BC Managed Care – PPO | Attending: Hematology and Oncology | Admitting: Hematology and Oncology

## 2020-11-25 DIAGNOSIS — Z510 Encounter for antineoplastic radiation therapy: Secondary | ICD-10-CM | POA: Insufficient documentation

## 2020-11-25 DIAGNOSIS — D0512 Intraductal carcinoma in situ of left breast: Secondary | ICD-10-CM | POA: Diagnosis not present

## 2020-11-25 NOTE — Assessment & Plan Note (Signed)
09/19/2020:Left lumpectomy (Dr. Jovita Kussmaul): high-grade DCIS with margins negative for carcinoma ER 0%, PR 0% Adjuvant radiation 11/04/2020-12/01/2020 Treatment plan: Surveillance, follow-up on an as-needed basis.

## 2020-11-26 ENCOUNTER — Ambulatory Visit
Admission: RE | Admit: 2020-11-26 | Discharge: 2020-11-26 | Disposition: A | Discharge status: Home / Self Care | Payer: BC Managed Care – PPO | Source: Ambulatory Visit | Attending: Radiation Oncology | Admitting: Radiation Oncology

## 2020-11-26 ENCOUNTER — Other Ambulatory Visit: Payer: Self-pay

## 2020-11-26 DIAGNOSIS — Z510 Encounter for antineoplastic radiation therapy: Secondary | ICD-10-CM | POA: Diagnosis not present

## 2020-11-27 ENCOUNTER — Encounter: Payer: Self-pay | Admitting: *Deleted

## 2020-11-27 ENCOUNTER — Ambulatory Visit
Admission: RE | Admit: 2020-11-27 | Discharge: 2020-11-27 | Disposition: A | Discharge status: Home / Self Care | Payer: BC Managed Care – PPO | Source: Ambulatory Visit | Attending: Radiation Oncology | Admitting: Radiation Oncology

## 2020-11-27 DIAGNOSIS — D0512 Intraductal carcinoma in situ of left breast: Secondary | ICD-10-CM

## 2020-11-27 DIAGNOSIS — Z510 Encounter for antineoplastic radiation therapy: Secondary | ICD-10-CM | POA: Diagnosis not present

## 2020-11-28 ENCOUNTER — Other Ambulatory Visit: Payer: Self-pay

## 2020-11-28 ENCOUNTER — Ambulatory Visit: Payer: BC Managed Care – PPO

## 2020-11-28 ENCOUNTER — Ambulatory Visit
Admission: RE | Admit: 2020-11-28 | Discharge: 2020-11-28 | Disposition: A | Discharge status: Home / Self Care | Payer: BC Managed Care – PPO | Source: Ambulatory Visit | Attending: Radiation Oncology | Admitting: Radiation Oncology

## 2020-11-28 DIAGNOSIS — Z510 Encounter for antineoplastic radiation therapy: Secondary | ICD-10-CM | POA: Diagnosis not present

## 2020-12-01 ENCOUNTER — Ambulatory Visit: Payer: BC Managed Care – PPO

## 2020-12-01 ENCOUNTER — Other Ambulatory Visit: Payer: Self-pay

## 2020-12-01 ENCOUNTER — Encounter: Payer: Self-pay | Admitting: Radiation Oncology

## 2020-12-01 ENCOUNTER — Ambulatory Visit
Admission: RE | Admit: 2020-12-01 | Discharge: 2020-12-01 | Disposition: A | Discharge status: Home / Self Care | Payer: BC Managed Care – PPO | Source: Ambulatory Visit | Attending: Radiation Oncology | Admitting: Radiation Oncology

## 2020-12-01 DIAGNOSIS — Z510 Encounter for antineoplastic radiation therapy: Secondary | ICD-10-CM | POA: Diagnosis not present

## 2020-12-13 NOTE — Progress Notes (Signed)
                                                                                                                                                             Patient Name: Marisa Golden MRN: [id number] DOB: Sep 03, 1955 Referring Physician: Lennie Odor (Profile Not Attached) Date of Service: 12/01/2020 [facility], [place]                                                        End Of Treatment Note  Diagnoses: D05.12-Intraductal carcinoma in situ of left breast  Cancer Staging: High grade ER/PR negative DCIS of the left breast.   Intent: Curative  Radiation Treatment Dates: 11/03/2020 through 12/01/2020 Site Technique Total Dose (Gy) Dose per Fx (Gy) Completed Fx Beam Energies  Breast, Left: Breast_Lt 3D 42.56/42.56 2.66 16/16 6XFFF  Breast, Left: Breast_Lt_Bst 3D 8/8 2 4/4 6X   Narrative: The patient tolerated radiation therapy relatively well. She developed anticipated skin changes in the treatment field.   Plan: The patient will receive a call in about one month from the radiation oncology department. She will continue follow up with Dr. Lindi Adie as well.   ________________________________________________    Carola Rhine, [facility]

## 2020-12-29 ENCOUNTER — Ambulatory Visit
Admission: RE | Admit: 2020-12-29 | Discharge: 2020-12-29 | Disposition: A | Discharge status: Home / Self Care | Payer: BC Managed Care – PPO | Source: Ambulatory Visit | Attending: Radiation Oncology | Admitting: Radiation Oncology

## 2020-12-29 DIAGNOSIS — D0512 Intraductal carcinoma in situ of left breast: Secondary | ICD-10-CM

## 2020-12-29 NOTE — Progress Notes (Signed)
  Radiation Oncology         (336) [phone] ________________________________  Name: Marisa Golden MRN: [id number]  Date of Service: 12/29/2020  DOB: [birth year]  Post Treatment Telephone Note  Diagnosis:   High grade ER/PR negative DCIS of the left breast.   Interval Since Last Radiation:  4 weeks    11/03/2020 through 12/01/2020 Site Technique Total Dose (Gy) Dose per Fx (Gy) Completed Fx Beam Energies  Breast, Left: Breast_Lt 3D 42.56/42.56 2.66 16/16 6XFFF  Breast, Left: Breast_Lt_Bst 3D 8/8 2 4/4 6X   Narrative:  The patient was contacted today for routine follow-up. During treatment she did very well with radiotherapy and did not have significant desquamation. She reports she feels that her skin changes have improved.  Impression/Plan: 1. High grade ER/PR negative DCIS of the left breast. . The patient has been doing well since completion of radiotherapy. We discussed that we would be happy to continue to follow her as needed, but she will also continue to follow up with Dr. Lindi Adie in medical oncology. She was counseled on skin care as well as measures to avoid sun exposure to this area.  2. Survivorship. We discussed the importance of survivorship evaluation and encouraged her to attend her upcoming visit with that clinic.    Carola Rhine, PAC

## 2021-02-18 ENCOUNTER — Telehealth: Payer: Self-pay | Admitting: Adult Health

## 2021-02-18 NOTE — Telephone Encounter (Signed)
Cancelled 01/13 appointment per patient's request, patient will call back to reschedule when ready.

## 2021-02-27 ENCOUNTER — Encounter: Payer: BC Managed Care – PPO | Admitting: Adult Health

## 2021-03-02 ENCOUNTER — Telehealth: Payer: Self-pay | Admitting: *Deleted

## 2021-04-07 DIAGNOSIS — D0512 Intraductal carcinoma in situ of left breast: Secondary | ICD-10-CM | POA: Diagnosis not present

## 2021-04-28 ENCOUNTER — Other Ambulatory Visit: Payer: Self-pay | Admitting: General Surgery

## 2021-04-28 DIAGNOSIS — Z9889 Other specified postprocedural states: Secondary | ICD-10-CM

## 2021-05-05 DIAGNOSIS — H2512 Age-related nuclear cataract, left eye: Secondary | ICD-10-CM | POA: Diagnosis not present

## 2021-05-05 DIAGNOSIS — H18413 Arcus senilis, bilateral: Secondary | ICD-10-CM | POA: Diagnosis not present

## 2021-05-05 DIAGNOSIS — H2513 Age-related nuclear cataract, bilateral: Secondary | ICD-10-CM | POA: Diagnosis not present

## 2021-05-05 DIAGNOSIS — H25043 Posterior subcapsular polar age-related cataract, bilateral: Secondary | ICD-10-CM | POA: Diagnosis not present

## 2021-05-05 DIAGNOSIS — H25013 Cortical age-related cataract, bilateral: Secondary | ICD-10-CM | POA: Diagnosis not present

## 2021-05-27 ENCOUNTER — Ambulatory Visit
Admission: RE | Admit: 2021-05-27 | Discharge: 2021-05-27 | Disposition: A | Discharge status: Home / Self Care | Payer: BC Managed Care – PPO | Source: Ambulatory Visit | Attending: General Surgery | Admitting: General Surgery

## 2021-05-27 DIAGNOSIS — Z9889 Other specified postprocedural states: Secondary | ICD-10-CM

## 2021-05-27 DIAGNOSIS — R922 Inconclusive mammogram: Secondary | ICD-10-CM | POA: Diagnosis not present

## 2021-06-03 ENCOUNTER — Encounter (HOSPITAL_COMMUNITY): Payer: Self-pay

## 2021-08-03 DIAGNOSIS — H2512 Age-related nuclear cataract, left eye: Secondary | ICD-10-CM | POA: Diagnosis not present

## 2021-08-04 DIAGNOSIS — H2511 Age-related nuclear cataract, right eye: Secondary | ICD-10-CM | POA: Diagnosis not present

## 2021-08-10 DIAGNOSIS — H25812 Combined forms of age-related cataract, left eye: Secondary | ICD-10-CM | POA: Diagnosis not present

## 2021-08-24 DIAGNOSIS — H2511 Age-related nuclear cataract, right eye: Secondary | ICD-10-CM | POA: Diagnosis not present

## 2021-08-31 DIAGNOSIS — H25811 Combined forms of age-related cataract, right eye: Secondary | ICD-10-CM | POA: Diagnosis not present

## 2022-01-25 DIAGNOSIS — J0100 Acute maxillary sinusitis, unspecified: Secondary | ICD-10-CM | POA: Diagnosis not present

## 2022-03-11 DIAGNOSIS — H1130 Conjunctival hemorrhage, unspecified eye: Secondary | ICD-10-CM | POA: Diagnosis not present

## 2022-06-04 IMAGING — MG DIGITAL DIAGNOSTIC BILAT W/ TOMO W/ CAD
9 series · 9 of 25 positions shown · non-contrast
Comparison: Previous exam(s).

CLINICAL DATA: Left lumpectomy.  Annual mammography.

EXAM:
DIGITAL DIAGNOSTIC BILATERAL MAMMOGRAM WITH TOMOSYNTHESIS AND CAD
TECHNIQUE: Bilateral digital diagnostic mammography and breast tomosynthesis
was performed. The images were evaluated with computer-aided
detection.

[L CC]
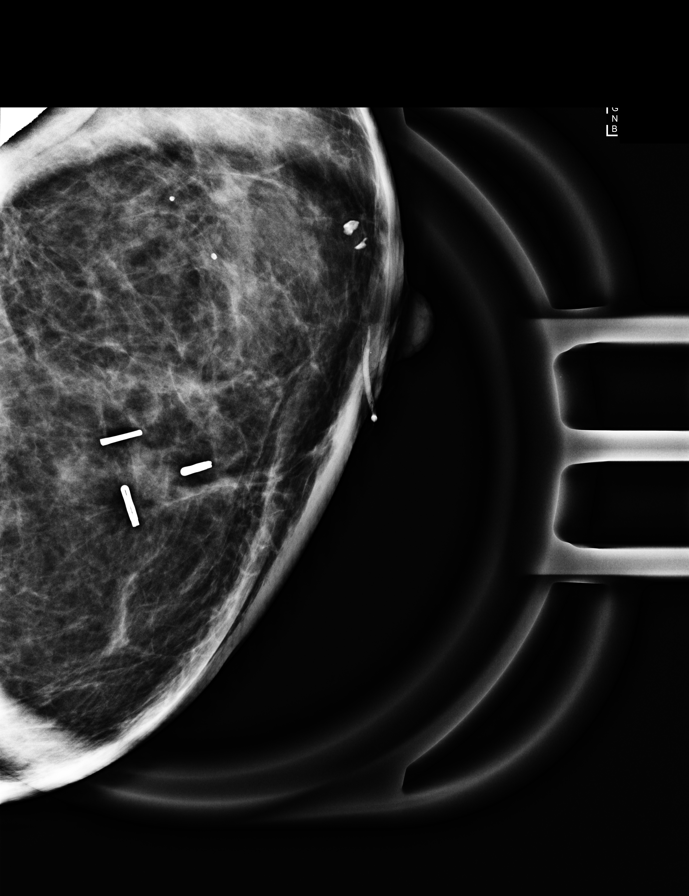

[L MLO synth-2D]
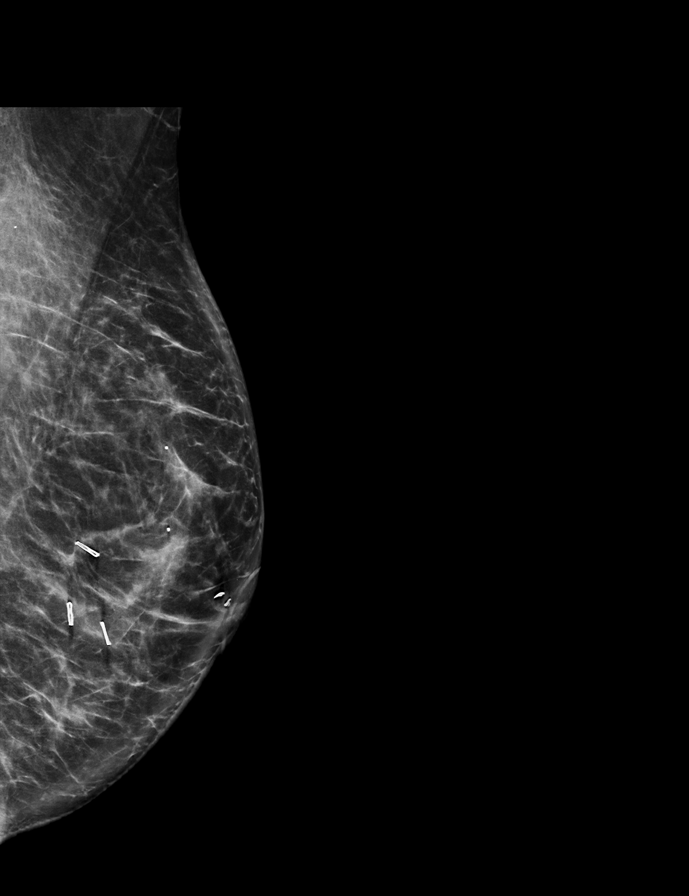

[R MLO synth-2D]
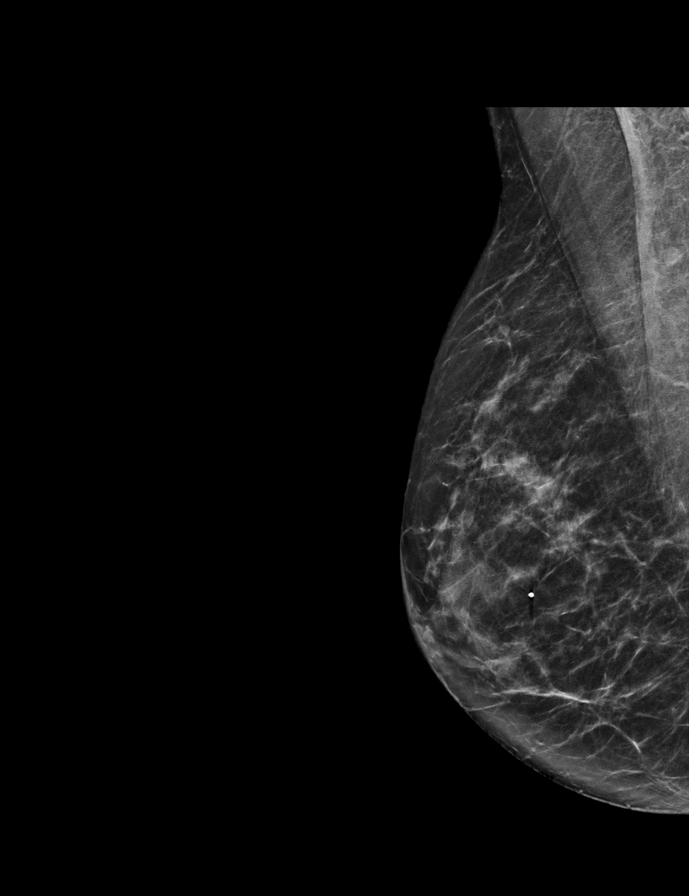

[L CC synth-2D]
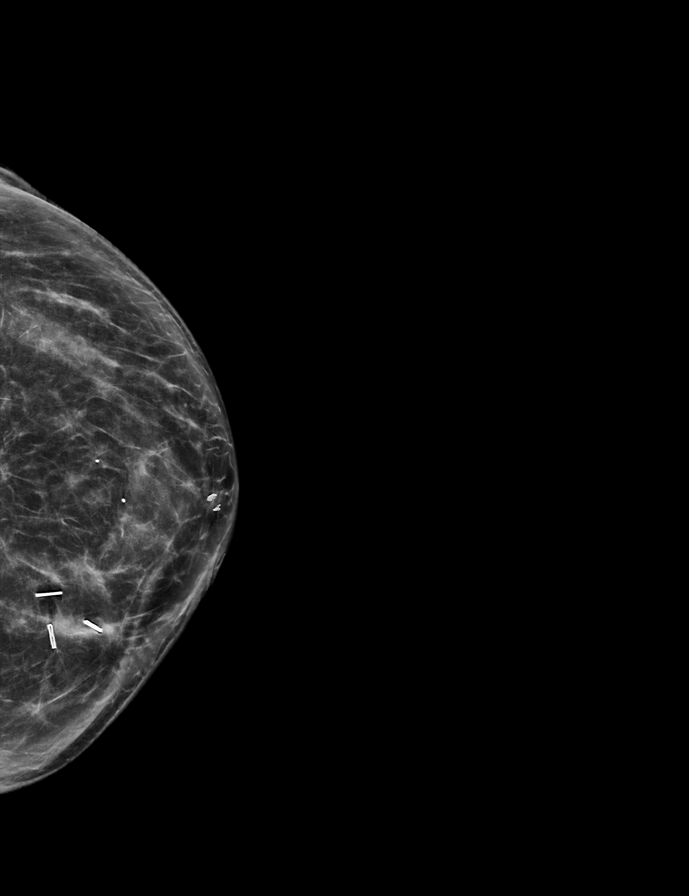

[R CC synth-2D]
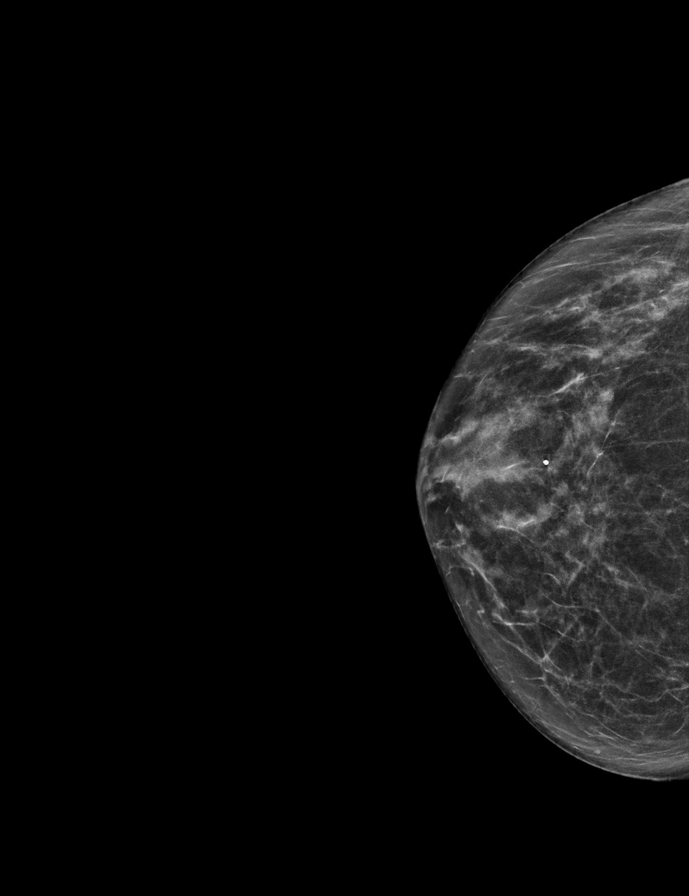

[R CC tomo · tomo slice 27/52.0]
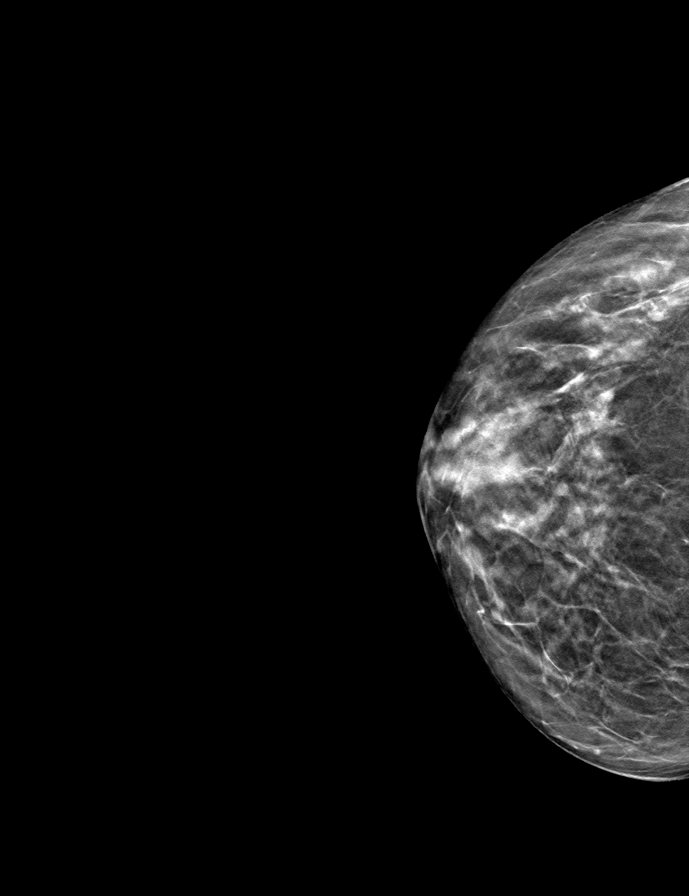

[L MLO tomo · tomo slice 36/71.0]
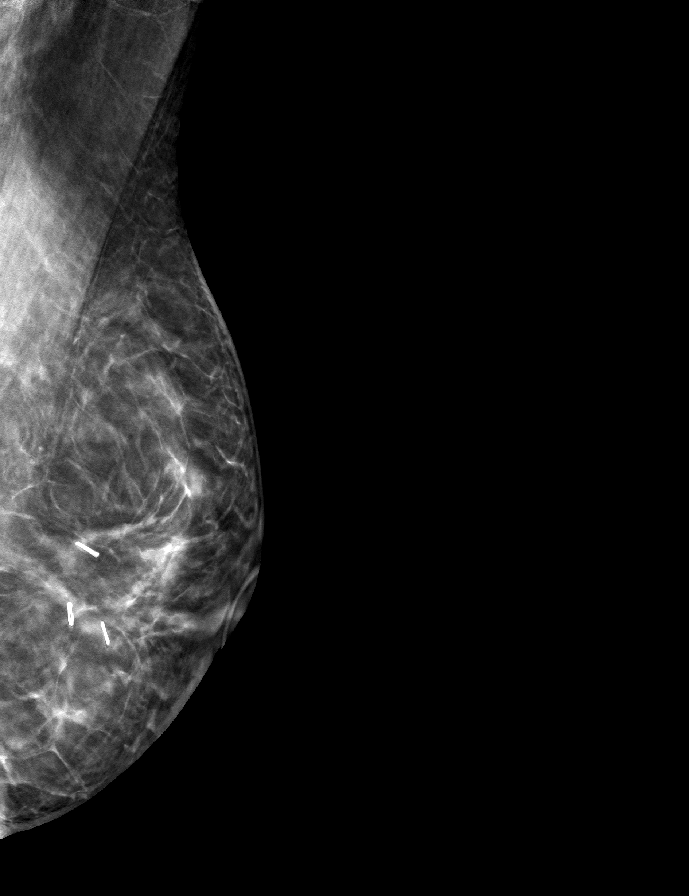

[R MLO tomo · tomo slice 27/53.0]
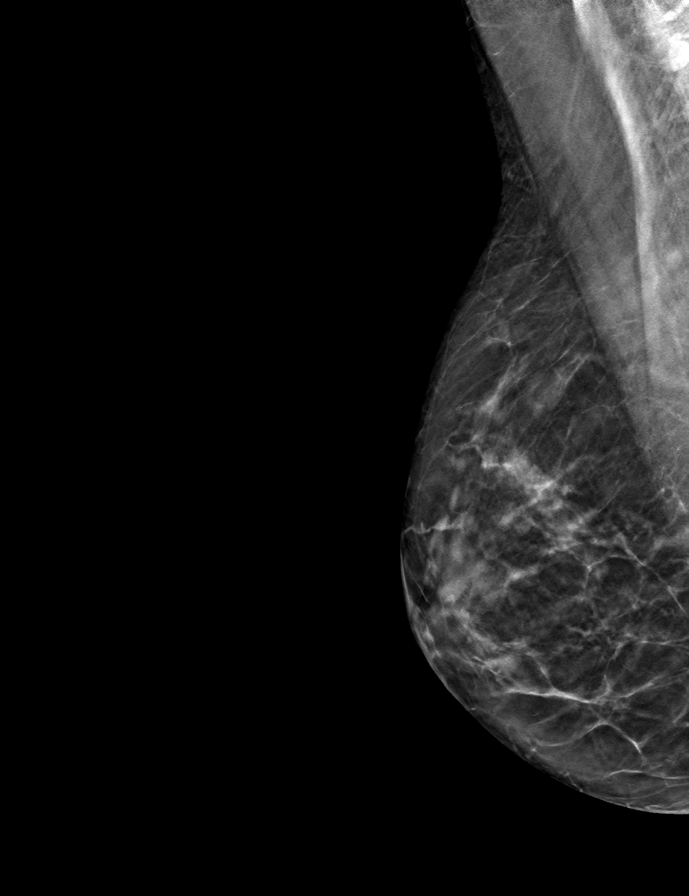

[L CC tomo · tomo slice 35/70.0]
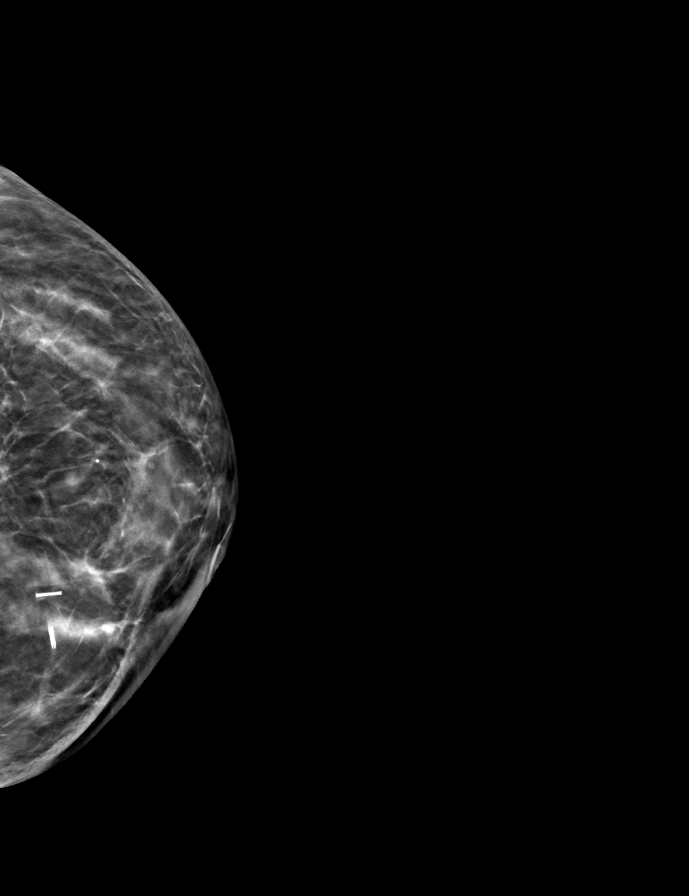

[9 of 25 positions shown; findings below may reference images not displayed]

ACR Breast Density Category b: There are scattered areas of
fibroglandular density.
FINDINGS: No suspicious masses, calcifications, or distortion are identified
in either breast. The left lumpectomy site appears as expected.
IMPRESSION: No mammographic evidence of malignancy.

RECOMMENDATION:
Annual diagnostic mammography.

I have discussed the findings and recommendations with the patient.
If applicable, a reminder letter will be sent to the patient
regarding the next appointment.

BI-RADS CATEGORY  2: Benign.

## 2022-06-28 ENCOUNTER — Other Ambulatory Visit: Payer: Self-pay | Admitting: General Surgery

## 2022-06-28 DIAGNOSIS — Z9889 Other specified postprocedural states: Secondary | ICD-10-CM

## 2022-07-09 ENCOUNTER — Ambulatory Visit
Admission: RE | Admit: 2022-07-09 | Discharge: 2022-07-09 | Disposition: A | Discharge status: Home / Self Care | Payer: BC Managed Care – PPO | Source: Ambulatory Visit | Attending: General Surgery | Admitting: General Surgery

## 2022-07-09 ENCOUNTER — Other Ambulatory Visit: Payer: Self-pay | Admitting: General Surgery

## 2022-07-09 DIAGNOSIS — R921 Mammographic calcification found on diagnostic imaging of breast: Secondary | ICD-10-CM

## 2022-07-09 DIAGNOSIS — N641 Fat necrosis of breast: Secondary | ICD-10-CM | POA: Diagnosis not present

## 2022-07-09 DIAGNOSIS — Z9889 Other specified postprocedural states: Secondary | ICD-10-CM

## 2022-07-13 ENCOUNTER — Ambulatory Visit
Admission: RE | Admit: 2022-07-13 | Discharge: 2022-07-13 | Disposition: A | Discharge status: Home / Self Care | Payer: BC Managed Care – PPO | Source: Ambulatory Visit | Attending: General Surgery | Admitting: General Surgery

## 2022-07-13 DIAGNOSIS — N6022 Fibroadenosis of left breast: Secondary | ICD-10-CM | POA: Diagnosis not present

## 2022-07-13 DIAGNOSIS — R921 Mammographic calcification found on diagnostic imaging of breast: Secondary | ICD-10-CM

## 2022-07-13 DIAGNOSIS — N641 Fat necrosis of breast: Secondary | ICD-10-CM | POA: Diagnosis not present

## 2022-07-13 HISTORY — PX: BREAST BIOPSY: SHX20

## 2022-08-12 DIAGNOSIS — Z01419 Encounter for gynecological examination (general) (routine) without abnormal findings: Secondary | ICD-10-CM | POA: Diagnosis not present

## 2022-08-12 DIAGNOSIS — Z681 Body mass index (BMI) 19 or less, adult: Secondary | ICD-10-CM | POA: Diagnosis not present

## 2023-02-17 DIAGNOSIS — R0989 Other specified symptoms and signs involving the circulatory and respiratory systems: Secondary | ICD-10-CM | POA: Diagnosis not present

## 2023-02-17 DIAGNOSIS — R509 Fever, unspecified: Secondary | ICD-10-CM | POA: Diagnosis not present

## 2023-02-17 DIAGNOSIS — J069 Acute upper respiratory infection, unspecified: Secondary | ICD-10-CM | POA: Diagnosis not present

## 2023-06-06 ENCOUNTER — Other Ambulatory Visit: Payer: Self-pay | Admitting: Physician Assistant

## 2023-06-06 DIAGNOSIS — Z1231 Encounter for screening mammogram for malignant neoplasm of breast: Secondary | ICD-10-CM

## 2023-06-22 ENCOUNTER — Ambulatory Visit

## 2023-07-13 ENCOUNTER — Other Ambulatory Visit: Payer: Self-pay | Admitting: Medical Genetics

## 2023-07-14 ENCOUNTER — Other Ambulatory Visit (HOSPITAL_COMMUNITY)
Admission: RE | Admit: 2023-07-14 | Discharge: 2023-07-14 | Disposition: A | Discharge status: Home / Self Care | Payer: Self-pay | Source: Ambulatory Visit | Attending: Medical Genetics | Admitting: Medical Genetics

## 2023-07-19 ENCOUNTER — Ambulatory Visit
Admission: RE | Admit: 2023-07-19 | Discharge: 2023-07-19 | Disposition: A | Discharge status: Home / Self Care | Source: Ambulatory Visit | Attending: Physician Assistant | Admitting: Physician Assistant

## 2023-07-19 DIAGNOSIS — Z1231 Encounter for screening mammogram for malignant neoplasm of breast: Secondary | ICD-10-CM

## 2023-07-22 LAB — GENECONNECT MOLECULAR SCREEN: Genetic Analysis Overall Interpretation: NEGATIVE
